# Patient Record
Sex: Female | Born: 1980 | Race: Black or African American | Hispanic: No | Marital: Single | Smoking: Current every day smoker
Health system: Southern US, Community
[De-identification: ages and names within clinical notes are randomized; demographics above are authoritative.]

## PROBLEM LIST (undated history)

## (undated) DIAGNOSIS — F419 Anxiety disorder, unspecified: Secondary | ICD-10-CM

## (undated) DIAGNOSIS — F329 Major depressive disorder, single episode, unspecified: Secondary | ICD-10-CM

## (undated) DIAGNOSIS — F32A Depression, unspecified: Secondary | ICD-10-CM

## (undated) DIAGNOSIS — G5600 Carpal tunnel syndrome, unspecified upper limb: Secondary | ICD-10-CM

---
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
## 2008-08-11 ENCOUNTER — Emergency Department (HOSPITAL_COMMUNITY): Admission: EM | Admit: 2008-08-11 | Discharge: 2008-08-11 | Payer: Self-pay | Admitting: Emergency Medicine

## 2008-12-17 ENCOUNTER — Emergency Department (HOSPITAL_COMMUNITY): Admission: EM | Admit: 2008-12-17 | Discharge: 2008-12-17 | Payer: Self-pay | Admitting: Emergency Medicine

## 2009-10-30 ENCOUNTER — Ambulatory Visit: Payer: Self-pay | Admitting: Internal Medicine

## 2010-02-12 ENCOUNTER — Emergency Department (HOSPITAL_COMMUNITY): Admission: EM | Admit: 2010-02-12 | Discharge: 2010-02-12 | Payer: Self-pay | Admitting: Emergency Medicine

## 2010-03-05 ENCOUNTER — Ambulatory Visit: Payer: Self-pay | Admitting: Internal Medicine

## 2011-01-13 IMAGING — CR DG SHOULDER 2+V*R*
3 series · 3 of 3 positions shown · non-contrast
Comparison: None.

CLINICAL DATA: Right shoulder pain for the past month.  No known
injury.

RIGHT SHOULDER - 2+ VIEW

[w shoulder ap internal righ]
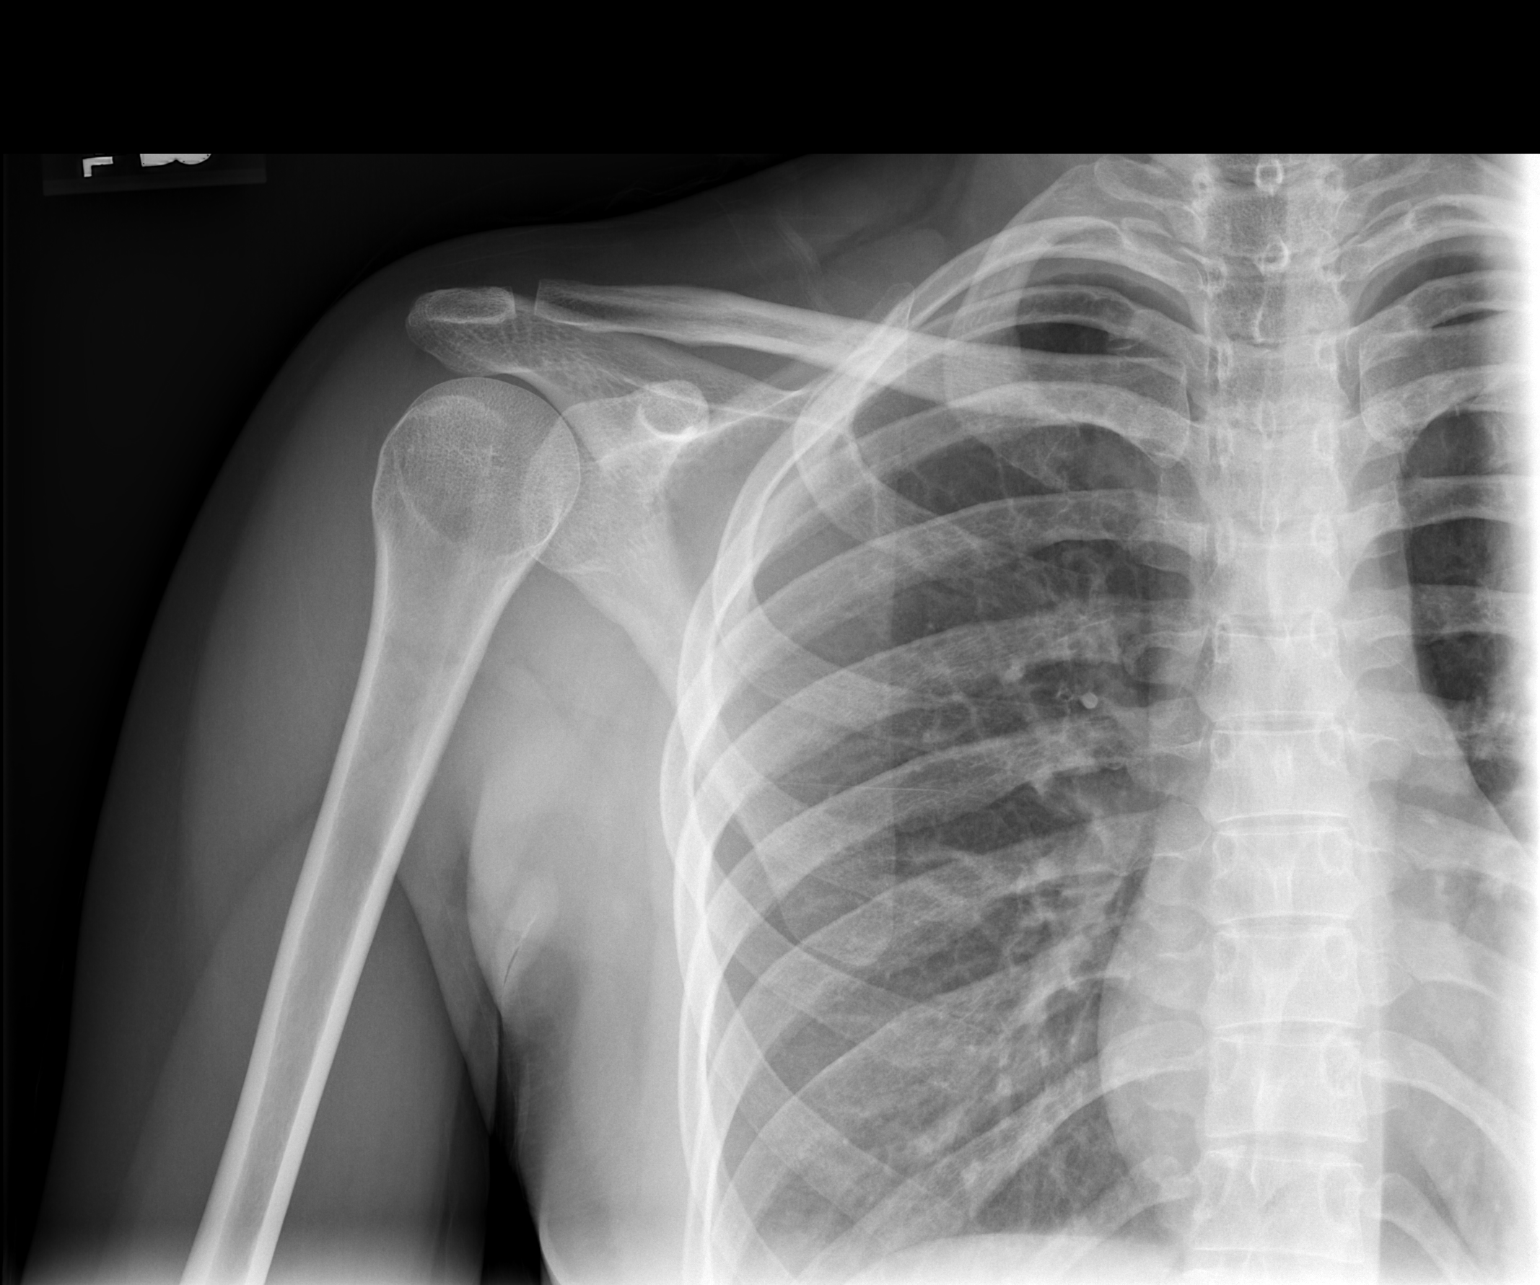

[w shoulder ap external righ]
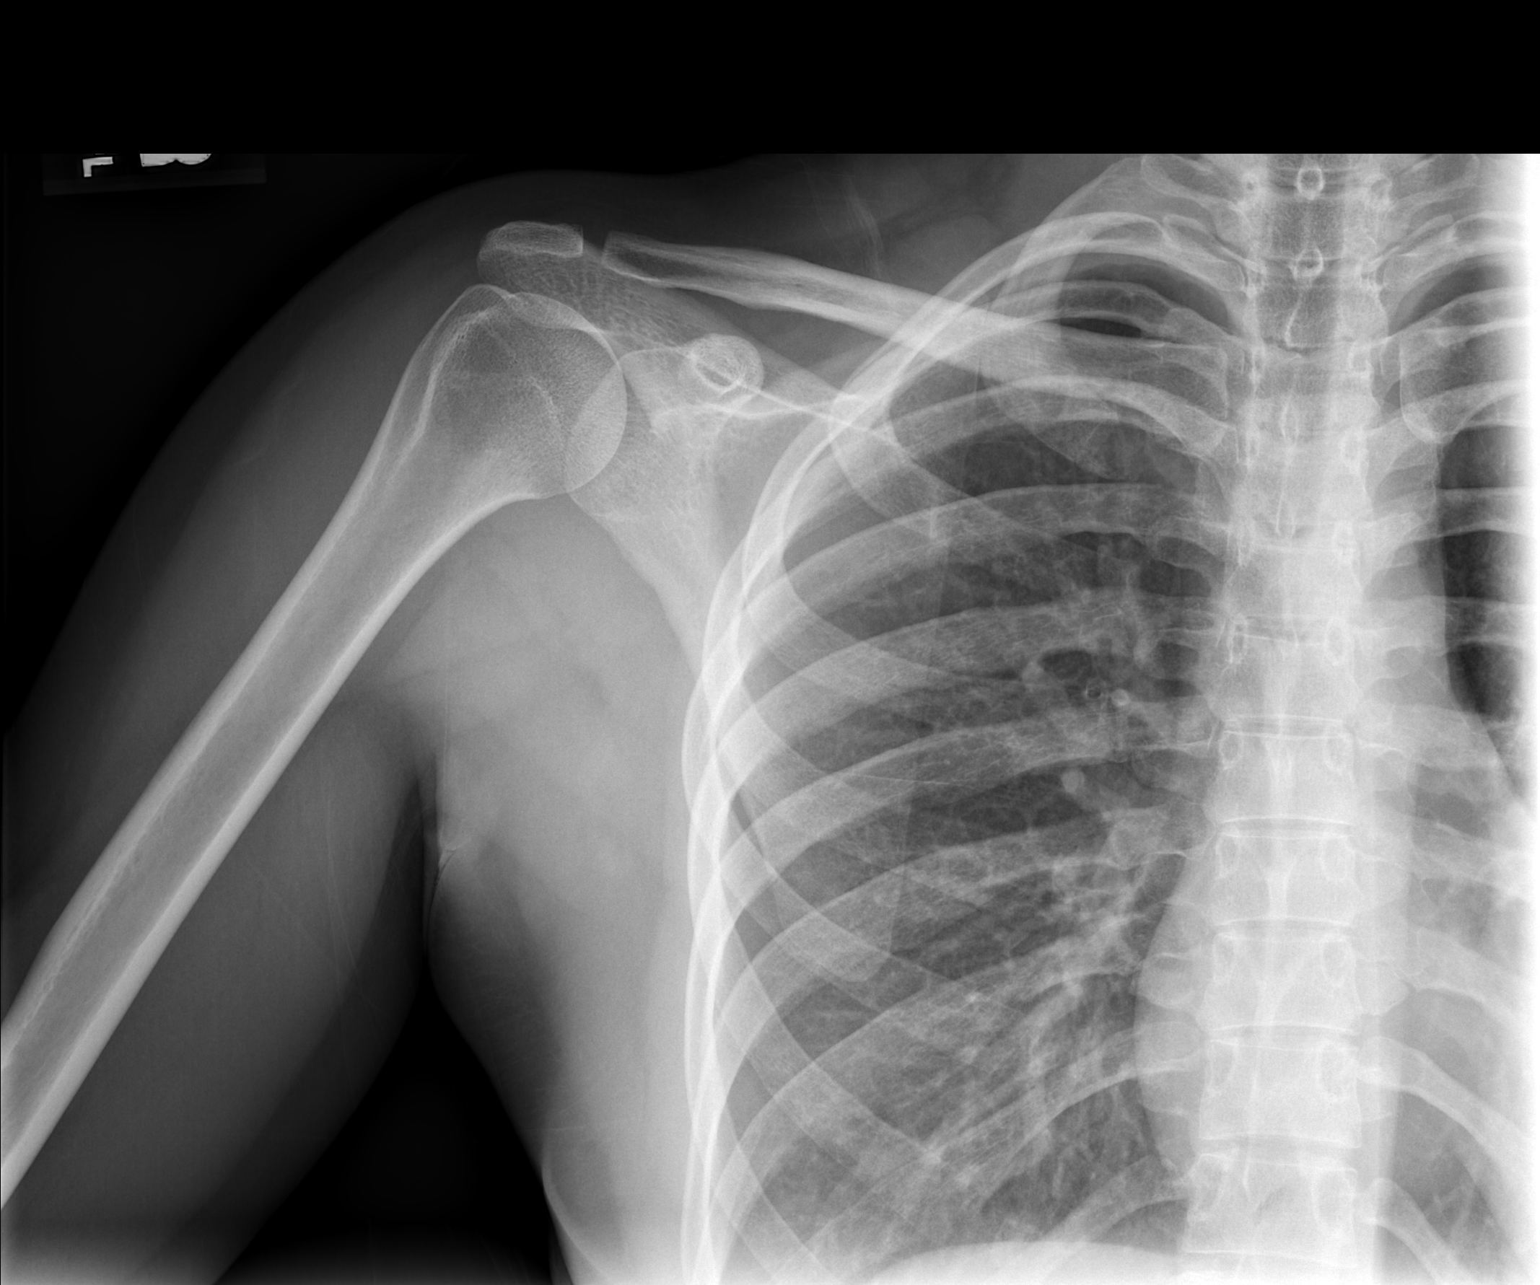

[w shoulder y view right]
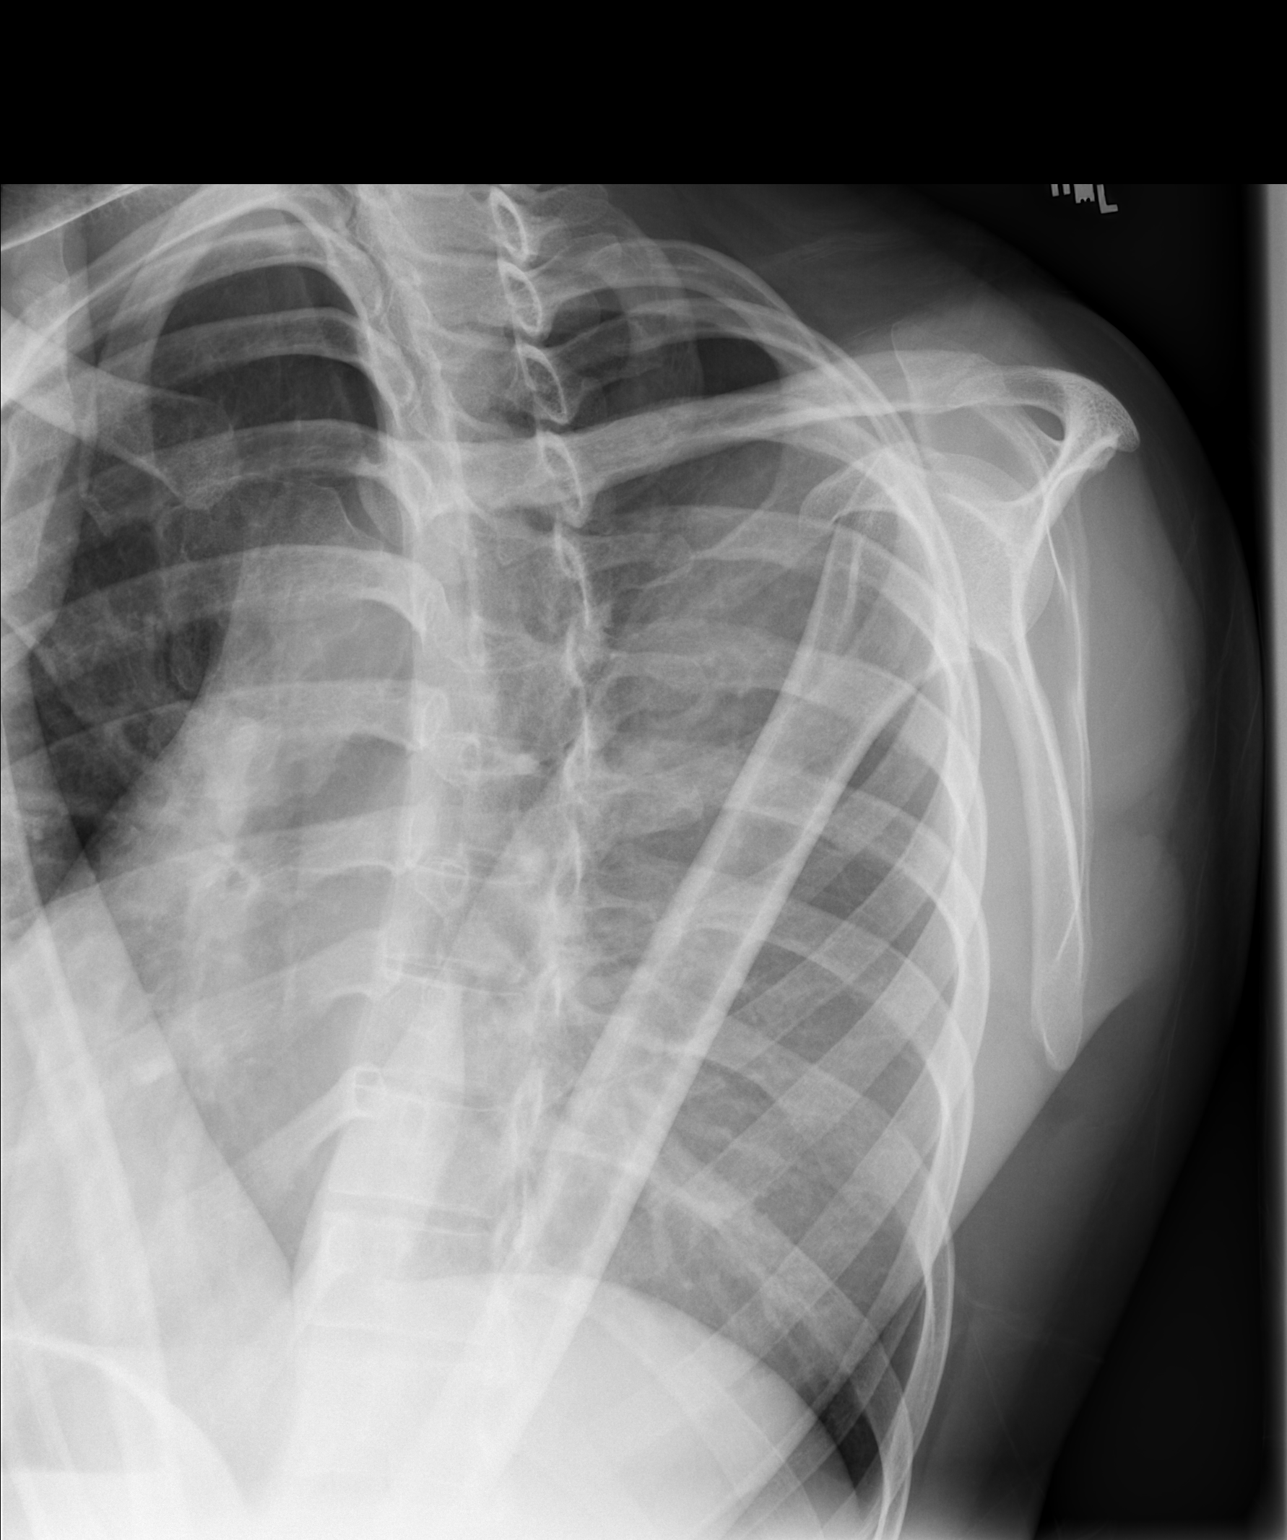

[3 of 3 positions shown; findings below may reference images not displayed]

FINDINGS: Normal appearing bones and soft tissues.
IMPRESSION: Normal examination.

## 2016-11-09 ENCOUNTER — Ambulatory Visit (HOSPITAL_COMMUNITY)
Admission: EM | Admit: 2016-11-09 | Discharge: 2016-11-09 | Disposition: A | Discharge status: Home / Self Care | Payer: Medicaid Other | Attending: Family Medicine | Admitting: Family Medicine

## 2016-11-09 ENCOUNTER — Encounter (HOSPITAL_COMMUNITY): Payer: Self-pay | Admitting: Emergency Medicine

## 2016-11-09 DIAGNOSIS — G5601 Carpal tunnel syndrome, right upper limb: Secondary | ICD-10-CM

## 2016-11-09 DIAGNOSIS — M778 Other enthesopathies, not elsewhere classified: Secondary | ICD-10-CM | POA: Diagnosis not present

## 2016-11-09 DIAGNOSIS — M7701 Medial epicondylitis, right elbow: Secondary | ICD-10-CM | POA: Diagnosis not present

## 2016-11-09 HISTORY — DX: Carpal tunnel syndrome, unspecified upper limb: G56.00

## 2016-11-09 MED ORDER — NAPROXEN 375 MG PO TABS: 375.0000 mg | ORAL_TABLET | Freq: Two times a day (BID) | ORAL | 0 refills | Status: DC

## 2016-11-09 NOTE — ED Triage Notes (Signed)
Something pulling in elbow whether she straightens or bends elbow.  No known injury.

## 2016-11-09 NOTE — Discharge Instructions (Signed)
Apply ice to the areas of soreness as we discussed. Do this before work, after work and during work if possible. Limit use of the arm and wrist as much as she possibly can. Wear the wrist splint at work and at home while working. You do not have to wear to bed. Take Naprosyn as prescribed and as needed for inflammation and pain. It takes several days for this to clear up. Follow-up with the orthopedist if you not getting better in a couple weeks. Call for an appointment his name and address his on this page.

## 2016-11-09 NOTE — ED Provider Notes (Signed)
CSN: [id number]     Arrival date & time 11/09/16  1022 History   First MD Initiated Contact with Patient 11/09/16 1104     No chief complaint on file.  (Consider location/radiation/quality/duration/timing/severity/associated sxs/prior Treatment) 36 year old female states that she has had jobs over the years that require repetitive movement of her right arm wrist, elbow etc. 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 She has recently developed right elbow pain primarily over the medial epicondyles. She also has wrist pain, tenderness that is exacerbated with flexion and extension. No blunt trauma or acute injury. Notes that she has had a history of diagnosis of carpal tunnel syndrome. And it seems to be flaring up at this time. It is associated with occasional numbness in the fingers of the right hand.      Past Medical History:  Diagnosis Date  . Carpal tunnel syndrome    Past Surgical History:  Procedure Laterality Date  . CESAREAN SECTION     No family history on file. Social History  Substance Use Topics  . Smoking status: Current Every Day Smoker  . Smokeless tobacco: Not on file  . Alcohol use Yes   OB History    No data available     Review of Systems  Constitutional: Negative.  Negative for activity change, chills and fever.  HENT: Negative.   Respiratory: Negative.   Cardiovascular: Negative.   Musculoskeletal:       As per HPI  Skin: Negative for color change, pallor and rash.  Neurological: Negative.   All other systems reviewed and are negative.   Allergies  Penicillins  Home Medications   Prior to Admission medications   Medication Sig Start Date End Date Taking? Authorizing Provider  acetaminophen (TYLENOL) 325 MG tablet Take 650 mg by mouth every 6 (six) hours as needed.   Yes [provider]  ibuprofen (ADVIL,MOTRIN) 200 MG tablet Take 200 mg by mouth every 6 (six) hours as needed.   Yes [provider]  naproxen (NAPROSYN) 375 MG tablet Take 1 tablet (375 mg total) by mouth 2 (two) times daily. 11/09/16   Hayden RasmussenMabe, Lounell Schumacher, NP   Meds Ordered and Administered this Visit  Medications - No data to display  BP 130/74 (BP Location: Left Arm)   Pulse 61   Temp 98.7 F (37.1 C) (Oral)   Resp 18   SpO2 100%  No data found.   Physical Exam  Constitutional: She is oriented to person, place, and time. She appears well-developed and well-nourished.  Eyes: EOM are normal.  Neck: Normal range of motion. Neck supple.  Cardiovascular: Normal rate.   Pulmonary/Chest: Effort normal.  Musculoskeletal: Normal range of motion. She exhibits tenderness. She exhibits no edema or deformity.  Tenderness over the tendons of the right medial epicondyles  and lesser to the lateral epicondyle. Also tenderness to the extensor surface of the right wrist with pain in the wrist and elbow associated with wrist extension against resistance. Distal neurovascular motor sensory is grossly intact. Brisk capillary refill and radial pulse 2+.  Neurological: She is alert and oriented to person, place, and time.  Skin: Skin is warm and dry.  Psychiatric: She has a normal mood and affect.  Nursing note and vitals reviewed.   Urgent Care Course     Procedures (including critical  care time)  Labs Review Labs Reviewed - No data to display  Imaging Review No results found.   Visual Acuity Review  Right Eye Distance:   Left Eye Distance:   Bilateral Distance:    Right Eye Near:   Left Eye Near:    Bilateral Near:         MDM   1. Right wrist tendinitis   2. Medial epicondylitis of right elbow   3. Carpal tunnel syndrome of right wrist    Apply ice to the areas of soreness as we discussed. Do this before work, after work and during work if possible. Limit use of the arm and  wrist as much as she possibly can. Wear the wrist splint at work and at home while working. You do not have to wear to bed. Take Naprosyn as prescribed and as needed for inflammation and pain. It takes several days for this to clear up. Follow-up with the orthopedist if you not getting better in a couple weeks. Call for an appointment his name and address his on this page. Meds ordered this encounter  Medications  . ibuprofen (ADVIL,MOTRIN) 200 MG tablet    Sig: Take 200 mg by mouth every 6 (six) hours as needed.  Marland Kitchen acetaminophen (TYLENOL) 325 MG tablet    Sig: Take 650 mg by mouth every 6 (six) hours as needed.  . naproxen (NAPROSYN) 375 MG tablet    Sig: Take 1 tablet (375 mg total) by mouth 2 (two) times daily.    Dispense:  20 tablet    Refill:  0    Order Specific Question:   Supervising Provider    Answer:   Elvina Sidle [[id number]]  Right wrist splint     Hayden Rasmussen, NP 11/09/16 1126

## 2018-12-21 ENCOUNTER — Emergency Department (HOSPITAL_COMMUNITY)
Admission: EM | Admit: 2018-12-21 | Discharge: 2018-12-22 | Disposition: A | Discharge status: Other Acute Inpatient Hospital | Payer: Medicaid Other | Attending: Emergency Medicine | Admitting: Emergency Medicine

## 2018-12-21 ENCOUNTER — Encounter (HOSPITAL_COMMUNITY): Payer: Self-pay

## 2018-12-21 ENCOUNTER — Other Ambulatory Visit: Payer: Self-pay

## 2018-12-21 DIAGNOSIS — X838XXA Intentional self-harm by other specified means, initial encounter: Secondary | ICD-10-CM | POA: Diagnosis not present

## 2018-12-21 DIAGNOSIS — Y999 Unspecified external cause status: Secondary | ICD-10-CM | POA: Insufficient documentation

## 2018-12-21 DIAGNOSIS — Z20828 Contact with and (suspected) exposure to other viral communicable diseases: Secondary | ICD-10-CM | POA: Insufficient documentation

## 2018-12-21 DIAGNOSIS — Z880 Allergy status to penicillin: Secondary | ICD-10-CM | POA: Diagnosis not present

## 2018-12-21 DIAGNOSIS — F17210 Nicotine dependence, cigarettes, uncomplicated: Secondary | ICD-10-CM | POA: Diagnosis not present

## 2018-12-21 DIAGNOSIS — R45851 Suicidal ideations: Secondary | ICD-10-CM | POA: Diagnosis not present

## 2018-12-21 DIAGNOSIS — F332 Major depressive disorder, recurrent severe without psychotic features: Secondary | ICD-10-CM | POA: Insufficient documentation

## 2018-12-21 DIAGNOSIS — T50902A Poisoning by unspecified drugs, medicaments and biological substances, intentional self-harm, initial encounter: Secondary | ICD-10-CM

## 2018-12-21 DIAGNOSIS — Y92038 Other place in apartment as the place of occurrence of the external cause: Secondary | ICD-10-CM | POA: Diagnosis not present

## 2018-12-21 DIAGNOSIS — Y939 Activity, unspecified: Secondary | ICD-10-CM | POA: Diagnosis not present

## 2018-12-21 DIAGNOSIS — T43022A Poisoning by tetracyclic antidepressants, intentional self-harm, initial encounter: Secondary | ICD-10-CM | POA: Insufficient documentation

## 2018-12-21 DIAGNOSIS — T1491XA Suicide attempt, initial encounter: Secondary | ICD-10-CM | POA: Diagnosis present

## 2018-12-21 HISTORY — DX: Depression, unspecified: F32.A

## 2018-12-21 LAB — CBC WITH DIFFERENTIAL/PLATELET
Abs Immature Granulocytes: 0.03 10*3/uL (ref 0.00–0.07)
Basophils Absolute: 0 10*3/uL (ref 0.0–0.1)
Basophils Relative: 0 %
Eosinophils Absolute: 0.1 10*3/uL (ref 0.0–0.5)
Eosinophils Relative: 1 %
HCT: 42.8 % (ref 36.0–46.0)
Hemoglobin: 14.4 g/dL (ref 12.0–15.0)
Immature Granulocytes: 0 %
Lymphocytes Relative: 19 %
Lymphs Abs: 1.9 10*3/uL (ref 0.7–4.0)
MCH: 33.9 pg (ref 26.0–34.0)
MCHC: 33.6 g/dL (ref 30.0–36.0)
MCV: 100.7 fL — ABNORMAL HIGH (ref 80.0–100.0)
Monocytes Absolute: 0.7 10*3/uL (ref 0.1–1.0)
Monocytes Relative: 7 %
Neutro Abs: 7.3 10*3/uL (ref 1.7–7.7)
Neutrophils Relative %: 73 %
Platelets: 342 10*3/uL (ref 150–400)
RBC: 4.25 MIL/uL (ref 3.87–5.11)
RDW: 15.4 % (ref 11.5–15.5)
WBC: 10 10*3/uL (ref 4.0–10.5)
nRBC: 0 % (ref 0.0–0.2)

## 2018-12-21 LAB — ACETAMINOPHEN LEVEL: Acetaminophen (Tylenol), Serum: <10 ug/mL — ABNORMAL LOW (ref 10–30)

## 2018-12-21 LAB — RAPID URINE DRUG SCREEN, HOSP PERFORMED
Amphetamines: POSITIVE — AB
Barbiturates: NOT DETECTED
Benzodiazepines: NOT DETECTED
Cocaine: POSITIVE — AB
Opiates: NOT DETECTED
Tetrahydrocannabinol: POSITIVE — AB

## 2018-12-21 LAB — COMPREHENSIVE METABOLIC PANEL
ALT: 11 U/L (ref 0–44)
AST: 17 U/L (ref 15–41)
Albumin: 4.1 g/dL (ref 3.5–5.0)
Alkaline Phosphatase: 67 U/L (ref 38–126)
Anion gap: 10 (ref 5–15)
BUN: 6 mg/dL (ref 6–20)
CO2: 25 mmol/L (ref 22–32)
Calcium: 8.6 mg/dL — ABNORMAL LOW (ref 8.9–10.3)
Chloride: 107 mmol/L (ref 98–111)
Creatinine, Ser: 0.61 mg/dL (ref 0.44–1.00)
GFR calc Af Amer: >60 mL/min (ref >60)
GFR calc non Af Amer: >60 mL/min (ref >60)
Glucose, Bld: 76 mg/dL (ref 70–99)
Potassium: 3.4 mmol/L — ABNORMAL LOW (ref 3.5–5.1)
Sodium: 142 mmol/L (ref 135–145)
Total Bilirubin: 0.5 mg/dL (ref 0.3–1.2)
Total Protein: 7.5 g/dL (ref 6.5–8.1)

## 2018-12-21 LAB — CBG MONITORING, ED: Glucose-Capillary: 76 mg/dL (ref 70–99)

## 2018-12-21 LAB — SALICYLATE LEVEL: Salicylate Lvl: <7 mg/dL (ref 2.8–30.0)

## 2018-12-21 LAB — ETHANOL: Alcohol, Ethyl (B): 24 mg/dL — ABNORMAL HIGH (ref <10)

## 2018-12-21 LAB — I-STAT BETA HCG BLOOD, ED (MC, WL, AP ONLY): I-stat hCG, quantitative: <5 m[IU]/mL (ref <5)

## 2018-12-21 LAB — SARS CORONAVIRUS 2 BY RT PCR (HOSPITAL ORDER, PERFORMED IN ~~LOC~~ HOSPITAL LAB): SARS Coronavirus 2: NEGATIVE

## 2018-12-21 MED ORDER — SODIUM CHLORIDE 0.9 % IV BOLUS
1000.0000 mL | Freq: Once | INTRAVENOUS | Status: AC
  Administered 2018-12-21: 1000 mL via INTRAVENOUS

## 2018-12-21 MED ORDER — POTASSIUM CHLORIDE CRYS ER 20 MEQ PO TBCR
40.0000 meq | EXTENDED_RELEASE_TABLET | Freq: Once | ORAL | Status: AC
  Administered 2018-12-21: 40 meq via ORAL
  Filled 2018-12-21: qty 2

## 2018-12-21 MED ORDER — ACETAMINOPHEN 325 MG PO TABS: 650.0000 mg | ORAL_TABLET | ORAL | Status: DC | PRN

## 2018-12-21 NOTE — BH Assessment (Signed)
Tele Assessment Note   Patient Name: Wanda SimmondsCailin Howard MRN: [id number] Referring Physician: Jodi GeraldsFord, Kelsey, PA-C Location of Patient: [facility] Location of Provider: Behavioral Health TTS Department  Wanda SimmondsCailin Howard is a 38 y.o. female who [facility] after being found unconscious on the gazebo outside an apartment complex.  Pt endorsed suicide attempt.  Pt is of no fixed address, and she is not followed by an outpatient psychiatric/therapy provider.  Pt provided history.  Pt reported that she is very depressed, and that on 12/20/2018 she tried to commit suicide by intentionally ingesting 3 tabs of remeron and 1/2 tab of Trazodone.  Pt endorsed continued suicidal ideation.  She also endorsed persistent despondency, insomnia (about two hours per night), fatigue; isolation; guilt; poor concentration.  Pt denied hallucination, self-injurious behavior, and homicidal ideation.  Pt endorsed daily use of marijuana, and episodic use of cocaine and alcohol.  Pt was positive for marijuana, alcohol, cocaine, and amphetamines (she siad she does not know how she is positive for amphetamines).  Pt reported she was treated inpatient when she was 16 -- [place] and [place]xia nervosa.  Pt requested inpatient care.  During assessment, Pt presented as alert and oriented.  She had fair eye contact and was cooperative.  She was dressed in scrubs, and she appeared appropriately groomed.  Pt's mood was depressed, and affect was blunted.  Pt's speech was soft and slow.  Pt's thought processes were within normal range, and thought content was logical and goal-oriented.  There was no evidence of delusion.  Pt's memory and concentration were fair.  Insight, judgment, and impulse control were deemed poor.  Consulted with Ephriam KnucklesS. Rankin, NP and Narda AmberJ. Norman, DO who determined that Pt meets inpatient criteria.  Diagnosis: Major depressive disorder, recurrent, severe, w/o  psychotic features  Past Medical History:  Past Medical History:  Diagnosis Date  . Carpal tunnel syndrome   . Depression     Past Surgical History:  Procedure Laterality Date  . CESAREAN SECTION      Family History: No family history on file.  Social History:  reports that she has been smoking cigarettes. She has been smoking about 1.00 pack per day. She has never used smokeless tobacco. She reports current alcohol use. She reports current drug use. Frequency: 7.00 times per week. Drugs: Amphetamines, Cocaine, and Marijuana.  Additional Social History:  Alcohol / Drug Use Pain Medications: See MAR Prescriptions: See MAR Over the Counter: See MAR History of alcohol / drug use?: Yes Substance #1 Name of Substance 1: Marijuana 1 - Amount (size/oz): varied amount 1 - Frequency: daily 1 - Duration: ongoing 1 - Last Use / Amount: 12/20/2018 Substance #2 Name of Substance 2: Alcohol 2 - Amount (size/oz): varied 2 - Frequency: weekly 2 - Duration: ongoing 2 - Last Use / Amount: 12/20/2018 Substance #3 Name of Substance 3: cocaine 3 - Amount (size/oz): varied 3 - Frequency: episodically 3 - Duration: ongoing 3 - Last Use / Amount: 12/20/2018 Substance #4 Name of Substance 4: Amphetamine 4 - Amount (size/oz): Pt is not sure if she uses or amount  CIWA: CIWA-Ar BP: 116/78 Pulse Rate: 79 COWS:    Allergies:  Allergies  Allergen Reactions  . Penicillins     Home Medications: (Not in a hospital admission)   OB/GYN Status:  No LMP recorded. Patient has had an injection.  General Assessment Data Location of Assessment: [facility] TTS Assessment: In system  Is this a Tele or Face-to-Face Assessment?: Tele Assessment Is this an Initial Assessment or a Re-assessment for this encounter?: Initial Assessment Patient Accompanied by:: N/A Language Other than English: No Living Arrangements: Homeless/Shelter What gender do you identify as?: Female Marital status:  Single Pregnancy Status: No Living Arrangements: Other (Comment) Can pt return to current living arrangement?: Yes Admission Status: Voluntary Is patient capable of signing voluntary admission?: Yes Referral Source: Self/Family/Friend Insurance type: [place] MCD     Crisis Care Plan Living Arrangements: Other (Comment) Name of Psychiatrist: None Name of Therapist: None  Education Status Is patient currently in school?: No Is the patient employed, unemployed or receiving disability?: Unemployed  Risk to self with the past 6 months Suicidal Ideation: Yes-Currently Present Has patient been a risk to self within the past 6 months prior to admission? : Yes Suicidal Intent: Yes-Currently Present Has patient had any suicidal intent within the past 6 months prior to admission? : Yes Is patient at risk for suicide?: Yes Suicidal Plan?: Yes-Currently Present Has patient had any suicidal plan within the past 6 months prior to admission? : Yes Specify Current Suicidal Plan: Pt intentionally ingested trazodone and remeron Access to Means: Yes Specify Access to Suicidal Means: street drugs What has been your use of drugs/alcohol within the last 12 months?: marijuana, alcohol, cocaine, amphetamines Previous Attempts/Gestures: No Intentional Self Injurious Behavior: Cutting Comment - Self Injurious Behavior: Hx of cutting; no recent cutting Family Suicide History: Unknown Recent stressful life event(s): Other (Comment)(Homelessness) Persecutory voices/beliefs?: No Depression: Yes Depression Symptoms: Despondent, Insomnia, Tearfulness, Isolating, Fatigue, Guilt, Loss of interest in usual pleasures, Feeling worthless/self pity Substance abuse history and/or treatment for substance abuse?: Yes Suicide prevention information given to non-admitted patients: Not applicable  Risk to Others within the past 6 months Homicidal Ideation: No Does patient have any lifetime risk of violence toward others  beyond the six months prior to admission? : No Thoughts of Harm to Others: No Current Homicidal Intent: No Current Homicidal Plan: No Access to Homicidal Means: No History of harm to others?: No Assessment of Violence: None Noted Does patient have access to weapons?: No Criminal Charges Pending?: No Does patient have a court date: No Is patient on probation?: No  Psychosis Hallucinations: None noted Delusions: None noted  Mental Status Report Appearance/Hygiene: Unremarkable, In scrubs Eye Contact: Fair Motor Activity: Freedom of movement, Unremarkable Speech: Logical/coherent, Soft, Slow Level of Consciousness: Drowsy Mood: Depressed Affect: Blunted Anxiety Level: None Thought Processes: Coherent, Relevant Judgement: Impaired Orientation: Place, Time, Situation, Person Obsessive Compulsive Thoughts/Behaviors: None  Cognitive Functioning Concentration: Fair Memory: Remote Intact, Recent Intact Is patient IDD: No Insight: Poor Impulse Control: Poor Appetite: Fair Have you had any weight changes? : No Change Sleep: Decreased Total Hours of Sleep: 2 Vegetative Symptoms: None  ADLScreening [facility] Assessment Services) Patient's cognitive ability adequate to safely complete daily activities?: Yes Patient able to express need for assistance with ADLs?: Yes Independently performs ADLs?: Yes (appropriate for developmental age)  Prior Inpatient Therapy Prior Inpatient Therapy: Yes Prior Therapy Dates: Not sure -- she was 16 Prior Therapy Facilty/Provider(s): [place] in [place] Reason for Treatment: Anorexia nervosa; depression  Prior Outpatient Therapy Prior Outpatient Therapy: No Does patient have an ACCT team?: No Does patient have Intensive In-House Services?  : No Does patient have [facility] services? : No Does patient have P4CC services?: No  ADL Screening (condition at time of admission) Patient's cognitive ability adequate to safely complete daily activities?:  Yes Is the patient deaf  or have difficulty hearing?: No Does the patient have difficulty seeing, even when wearing glasses/contacts?: No Does the patient have difficulty concentrating, remembering, or making decisions?: No Patient able to express need for assistance with ADLs?: Yes Independently performs ADLs?: Yes (appropriate for developmental age) Does the patient have difficulty walking or climbing stairs?: No Weakness of Legs: None Weakness of Arms/Hands: None  Home Assistive Devices/Equipment Home Assistive Devices/Equipment: None  Therapy Consults (therapy consults require a physician order) PT Evaluation Needed: No OT Evalulation Needed: No SLP Evaluation Needed: No Abuse/Neglect Assessment (Assessment to be complete while patient is alone) Abuse/Neglect Assessment Can Be Completed: Yes Physical Abuse: Denies Verbal Abuse: Denies Sexual Abuse: Denies Exploitation of patient/patient's resources: Denies Self-Neglect: Denies Values / Beliefs Cultural Requests During Hospitalization: None Spiritual Requests During Hospitalization: None Consults Spiritual Care Consult Needed: No Social Work Consult Needed: No Regulatory affairs officer (For Healthcare) Does Patient Have a Medical Advance Directive?: No Would patient like information on creating a medical advance directive?: No - Patient declined          Disposition:  Disposition Initial Assessment Completed for this Encounter: Yes Disposition of Patient: Admit(Per S. Rankin, NP, Pt meets inpt criteria --300)  This service was provided via telemedicine using a 2-way, interactive audio and video technology.  Names of all persons participating in this telemedicine service and their role in this encounter. Name: Wanda, Howard Role: Pt             Marlowe Aschoff 12/21/2018 12:22 PM

## 2018-12-21 NOTE — ED Notes (Signed)
Wanda Howard/Poison Control - Recommend EKG, electrolytes. Tylenol, salicylate levels. Fluids as needed.observe for 8 hours or until baseline achieved. Use Narcan cautiously.

## 2018-12-21 NOTE — ED Notes (Signed)
Patient is refusing Covid-19 test.

## 2018-12-21 NOTE — ED Notes (Signed)
Bed: [facility]B Expected date:  Expected time:  Means of arrival:  Comments: EMS-OD 

## 2018-12-21 NOTE — ED Notes (Signed)
Bed: [facility] Expected date:  Expected time:  Means of arrival:  Comments: 

## 2018-12-21 NOTE — ED Triage Notes (Signed)
Per EMS- Patient was found on a gazebo floor at [employer]. Patient reported that she was recently kicked out of her residence. Patient reported that she did want to kill herself. Patient states she took 1/2 tab Tramadol and Remeron 30 mg tabs x 4. Patient having Tremors.

## 2018-12-21 NOTE — ED Provider Notes (Signed)
[facility]-EMERGENCY DEPT Provider Note   CSN: [id number] Arrival date & time: 12/21/18  0856    History   Chief Complaint Chief Complaint  Patient presents with   Drug Overdose   Suicidal    HPI Wanda Howard is a 38 y.o. female.     Wanda Howard is a 38 y.o. female with history of carpal tunnel syndrome and depression, [facility].  Patient reports that her depression has been worsening and she wanted to kill herself, she reports she took half a tablet of tramadol and 4-1/2 30 mg of Remeron.  On arrival she is tearful and tremulous, reporting that her depression was too much and she did not want to deal with it anymore.  She reports the Remeron was prescribed to her and she had half a tablet of tramadol left.  She reports drinking last night, denies any other drug use or ingestions.  She denies any HI or AVH.  Denies any focal medical complaints today.  She has not had any fevers or recent illness.  She denies chest pain, shortness of breath, abdominal pain, nausea, vomiting, headaches, vision changes or weakness.  No other aggravating or alleviating factors.  Patient is reticent to talk more about her depression and stressors in her life but does report she was recently kicked out of her residence.  She was found on the floor of a gazebo by EMS [facility].     Past Medical History:  Diagnosis Date   Carpal tunnel syndrome     There are no active problems to display for this patient.   Past Surgical History:  Procedure Laterality Date   CESAREAN SECTION       OB History   No obstetric history on file.      Home Medications    Prior to Admission medications   Medication Sig Start Date End Date Taking? Authorizing Provider  acetaminophen (TYLENOL) 325 MG tablet Take 650 mg by mouth every 6 (six) hours as needed.    [provider]  ibuprofen (ADVIL,MOTRIN) 200 MG tablet Take 200 mg by  mouth every 6 (six) hours as needed.    [provider]  naproxen (NAPROSYN) 375 MG tablet Take 1 tablet (375 mg total) by mouth 2 (two) times daily. 11/09/16   Hayden RasmussenMabe, David, NP    Family History No family history on file.  Social History Social History   Tobacco Use   Smoking status: Current Every Day Smoker    Packs/day: 1.00    Types: Cigarettes   Smokeless tobacco: Never Used  Substance Use Topics   Alcohol use: Yes   Drug use: No     Allergies   Penicillins   Review of Systems Review of Systems  Constitutional: Negative for chills and fever.  HENT: Negative.   Eyes: Negative for visual disturbance.  Respiratory: Negative for cough.   Cardiovascular: Negative for chest pain.  Gastrointestinal: Negative for abdominal pain, nausea and vomiting.  Genitourinary: Negative for dysuria and frequency.  Musculoskeletal: Negative for arthralgias and myalgias.  Skin: Negative for color change and rash.  Neurological: Negative for syncope, light-headedness and headaches.  Psychiatric/Behavioral: Positive for dysphoric mood, self-injury and suicidal ideas. Negative for hallucinations. The patient is not nervous/anxious.      Physical Exam Updated Vital Signs BP 132/88    Pulse 83    Temp 98.1 F (36.7 C) (Oral)    Resp 20    Ht 4\' 11"  (  1.499 m)    Wt 49.9 kg    SpO2 100%    BMI 22.22 kg/m   Physical Exam Vitals signs and nursing note reviewed.  Constitutional:      General: She is not in acute distress.    Appearance: Normal appearance. She is well-developed and normal weight. She is not diaphoretic.     Comments: Tearful and tremulous but in no acute distress.  Alert and oriented  HENT:     Head: Normocephalic and atraumatic.     Mouth/Throat:     Mouth: Mucous membranes are moist.     Pharynx: Oropharynx is clear.     Comments: Multiple decaying teeth noted. Eyes:     General:        Right eye: No discharge.        Left eye: No discharge.      Extraocular Movements: Extraocular movements intact.     Conjunctiva/sclera: Conjunctivae normal.     Pupils: Pupils are equal, round, and reactive to light.  Neck:     Musculoskeletal: Neck supple.  Cardiovascular:     Rate and Rhythm: Normal rate and regular rhythm.     Pulses: Normal pulses.     Heart sounds: Normal heart sounds. No murmur. No friction rub. No gallop.   Pulmonary:     Effort: Pulmonary effort is normal. No respiratory distress.     Breath sounds: Normal breath sounds.     Comments: Respirations equal and unlabored, patient able to speak in full sentences, lungs clear to auscultation bilaterally Abdominal:     General: Abdomen is flat. Bowel sounds are normal. There is no distension.     Palpations: Abdomen is soft. There is no mass.     Tenderness: There is no abdominal tenderness. There is no guarding.     Comments: Abdomen soft, nondistended, nontender to palpation in all quadrants without guarding or peritoneal signs  Musculoskeletal:        General: No deformity.  Skin:    General: Skin is warm and dry.  Neurological:     Mental Status: She is alert.     Coordination: Coordination normal.     Comments: Speech is clear, able to follow commands CN III-XII intact Normal strength in upper and lower extremities bilaterally including dorsiflexion and plantar flexion, strong and equal grip strength Sensation normal to light and sharp touch Moves extremities without ataxia, coordination intact  Psychiatric:        Attention and Perception: She does not perceive auditory or visual hallucinations.        Mood and Affect: Mood is depressed. Affect is tearful.        Speech: Speech normal.        Behavior: Behavior is withdrawn. Behavior is cooperative.        Thought Content: Thought content includes suicidal ideation. Thought content does not include homicidal ideation. Thought content includes suicidal plan.      ED Treatments / Results  Labs (all labs ordered  are listed, but only abnormal results are displayed) Labs Reviewed  COMPREHENSIVE METABOLIC PANEL - Abnormal; Notable for the following components:      Result Value   Potassium 3.4 (*)    Calcium 8.6 (*)    All other components within normal limits  ETHANOL - Abnormal; Notable for the following components:   Alcohol, Ethyl (B) 24 (*)    All other components within normal limits  RAPID URINE DRUG SCREEN, HOSP PERFORMED - Abnormal; Notable for  the following components:   Cocaine POSITIVE (*)    Amphetamines POSITIVE (*)    Tetrahydrocannabinol POSITIVE (*)    All other components within normal limits  CBC WITH DIFFERENTIAL/PLATELET - Abnormal; Notable for the following components:   MCV 100.7 (*)    All other components within normal limits  ACETAMINOPHEN LEVEL - Abnormal; Notable for the following components:   Acetaminophen (Tylenol), Serum <10 (*)    All other components within normal limits  SARS CORONAVIRUS 2 (HOSPITAL ORDER, [place] LAB)  SALICYLATE LEVEL  CBG MONITORING, ED  I-STAT BETA HCG BLOOD, ED ([facility], [facility], [facility] ONLY)    EKG EKG Interpretation  Date/Time:  Wednesday December 21 2018 09:05:48 EDT Ventricular Rate:  90 PR Interval:    QRS Duration: 73 QT Interval:  352 QTC Calculation: 431 R Axis:   75 Text Interpretation:  Sinus rhythm Artifact No significant change was found Confirmed by Jola Schmidt [phone]) on 12/21/2018 9:19:12 AM   Radiology No results found.  Procedures Procedures (including critical care time)  Medications Ordered in ED Medications  potassium chloride SA (K-DUR) CR tablet 40 mEq (has no administration in time range)  sodium chloride 0.9 % bolus 1,000 mL (1,000 mLs Intravenous New Bag/Given 12/21/18 1014)     Initial Impression / Assessment and Plan / ED Course  I have reviewed the triage vital signs and the nursing notes.  Pertinent labs & imaging results that were available during my care of the patient were  reviewed by me and considered in my medical decision making (see chart for details).  Patient presents after intentional overdose with 4 tablets of Remeron and half a tablet of tramadol.  Reports her intent was to kill herself, she has been dealing with worsening depression and does not want to deal with it anymore.  She recently lost her housing.  She denies previous attempts to harm herself, denies any other ingestions.  No focal medical complaints, vitals normal on arrival.  Patient is tearful and tremulous but has no other focal findings on exam.  Normal neurologic exam.  No evidence of trauma.  Poison control consulted and they recommend 8-hour observation, EKG, electrolytes and Tylenol level.  Fluids as needed.  Screening labs reassuring, no leukocytosis, normal hemoglobin, potassium of 3.4 but no other electrolyte derangements, normal renal and liver function.  Ethanol level slightly elevated at 24, patient does report drinking last night, negative acetaminophen and salicylate levels, negative pregnancy.  UDS is positive for cocaine, amphetamines and THC, coronavirus test pending.  We will continue to observe patient for 8 hours.  As recommended by poison control but vitals are remaining stable and she seems to be at her baseline mental status.  TTS is seen and evaluated patient and they recommend inpatient treatment at [facility] once medically cleared.  Patient has been observed here in the emergency department for ~8 hours, patient has returned to baseline, and at this time is medically cleared.  TTS has recommended inpatient treatment, patient will be transferred to behavioral health Hospital when bed is available.  Final Clinical Impressions(s) / ED Diagnoses   Final diagnoses:  Intentional drug overdose, initial encounter [facility])  Suicidal ideation    ED Discharge Orders    None       Janet Berlin 12/21/18 1638    Jola Schmidt, MD 12/24/18 [phone]

## 2018-12-21 NOTE — ED Notes (Signed)
Bed: WA13 Expected date:  Expected time:  Means of arrival:  Comments: Hold for RES B 

## 2018-12-22 ENCOUNTER — Other Ambulatory Visit: Payer: Self-pay

## 2018-12-22 ENCOUNTER — Encounter (HOSPITAL_COMMUNITY): Payer: Self-pay

## 2018-12-22 ENCOUNTER — Inpatient Hospital Stay (HOSPITAL_COMMUNITY)
Admission: AD | Admit: 2018-12-22 | Discharge: 2018-12-26 | DRG: 885 | Disposition: A | Discharge status: Home / Self Care | Payer: Medicaid Other | Source: Intra-hospital | Attending: Psychiatry | Admitting: Psychiatry

## 2018-12-22 DIAGNOSIS — R45851 Suicidal ideations: Secondary | ICD-10-CM | POA: Diagnosis present

## 2018-12-22 DIAGNOSIS — Z1159 Encounter for screening for other viral diseases: Secondary | ICD-10-CM

## 2018-12-22 DIAGNOSIS — F1410 Cocaine abuse, uncomplicated: Secondary | ICD-10-CM | POA: Diagnosis present

## 2018-12-22 DIAGNOSIS — F332 Major depressive disorder, recurrent severe without psychotic features: Principal | ICD-10-CM | POA: Diagnosis present

## 2018-12-22 DIAGNOSIS — Z590 Homelessness: Secondary | ICD-10-CM

## 2018-12-22 DIAGNOSIS — F17210 Nicotine dependence, cigarettes, uncomplicated: Secondary | ICD-10-CM | POA: Diagnosis present

## 2018-12-22 MED ORDER — NICOTINE 21 MG/24HR TD PT24
21.0000 mg | MEDICATED_PATCH | Freq: Every day | TRANSDERMAL | Status: DC
  Filled 2018-12-22 (×6): qty 1

## 2018-12-22 MED ORDER — HYDROXYZINE HCL 25 MG PO TABS
25.0000 mg | ORAL_TABLET | Freq: Three times a day (TID) | ORAL | Status: DC | PRN
  Administered 2018-12-23: 25 mg via ORAL
  Filled 2018-12-22: qty 1

## 2018-12-22 MED ORDER — LORAZEPAM 1 MG PO TABS
1.0000 mg | ORAL_TABLET | Freq: Once | ORAL | Status: AC
  Administered 2018-12-22: 1 mg via ORAL
  Filled 2018-12-22: qty 1

## 2018-12-22 MED ORDER — TRAZODONE HCL 50 MG PO TABS
50.0000 mg | ORAL_TABLET | Freq: Every evening | ORAL | Status: DC | PRN
  Administered 2018-12-22: 50 mg via ORAL
  Filled 2018-12-22 (×5): qty 1

## 2018-12-22 NOTE — BH Assessment (Signed)
[facility] Assessment Progress Note  Per Buford Dresser, DO, this pt requires psychiatric hospitalization at this time.  Letitia Libra, RN, [facility] has assigned pt to [facility] Rm 302-2; [facility] will be ready to receive pt at 17:30.  Pt has signed Voluntary Admission and Consent for Treatment, as well as Consent to Release Information to a friend, and signed forms have been faxed to [facility]  Pt's nurse, Caren Griffins, has been notified, and agrees to send original paperwork along with pt via [facility], and to call report to [phone].  Jalene Mullet, [place] Coordinator [phone]

## 2018-12-22 NOTE — ED Notes (Signed)
Report called to [facility], nurse Demaris Callander.  Patient to be transported after 7:30 tonight

## 2018-12-22 NOTE — ED Notes (Signed)
Sitter order placed by this RN.  
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
## 2018-12-22 NOTE — ED Notes (Signed)
TTS at bedside. 

## 2018-12-22 NOTE — ED Notes (Signed)
Patient is resting comfortably. Sitter at bedside.  

## 2018-12-22 NOTE — ED Notes (Signed)
Patient moved over to TCU room 30 to wait for her transfer to behavioral health.

## 2018-12-22 NOTE — Discharge Summary (Addendum)
  Patient to be transferred to [facility] for inpatient psychiatric treatment  Patient seen face-to-face for psychiatric evaluation, chart reviewed and case discussed with the physician extender and developed treatment plan. Reviewed the information documented and agree with the treatment plan.  Buford Dresser, DO 12/22/18 4:31 PM

## 2018-12-22 NOTE — ED Notes (Signed)
Patient ambulatory to restroom with no assistance no problems.

## 2018-12-22 NOTE — ED Notes (Signed)
Patient wanded and transported over to [place].   Belongings at nursing station. Several bags.

## 2018-12-22 NOTE — Consult Note (Addendum)
  Patient accepted to [facility] for inpatient psychiatric treatment.    Wanda B. Rankin, NP  Patient seen face-to-face for psychiatric evaluation, chart reviewed and case discussed with the physician extender and developed treatment plan. Reviewed the information documented and agree with the treatment plan.  Buford Dresser, DO 12/22/18 4:31 PM

## 2018-12-22 NOTE — ED Notes (Signed)
Patient is resting comfortably. 

## 2018-12-22 NOTE — ED Notes (Signed)
Bed: WA30 Expected date:  Expected time:  Means of arrival:  Comments: Hall D 

## 2018-12-22 NOTE — Tx Team (Signed)
Initial Treatment Plan 12/22/2018 10:33 PM [name] TWS:[id number]    PATIENT STRESSORS: Financial difficulties Health problems Substance abuse Traumatic event   PATIENT STRENGTHS: Ability for insight Average or above average intelligence Capable of independent living Communication skills General fund of knowledge Motivation for treatment/growth Physical Health   PATIENT IDENTIFIED PROBLEMS: "depression"  "anxiety"  Substance abuse                 DISCHARGE CRITERIA:  Ability to meet basic life and health needs Adequate post-discharge living arrangements Improved stabilization in mood, thinking, and/or behavior Medical problems require only outpatient monitoring  PRELIMINARY DISCHARGE PLAN: Attend aftercare/continuing care group Attend PHP/IOP Attend 12-step recovery group Outpatient therapy Placement in alternative living arrangements  PATIENT/FAMILY INVOLVEMENT: This treatment plan has been presented to and reviewed with the patient, Wanda Howard.  The patient and family have been given the opportunity to ask questions and make suggestions.  Baron Sane, RN 12/22/2018, 10:33 PM

## 2018-12-22 NOTE — ED Notes (Signed)
Patient updated on plan of care

## 2018-12-23 DIAGNOSIS — F332 Major depressive disorder, recurrent severe without psychotic features: Principal | ICD-10-CM

## 2018-12-23 MED ORDER — GABAPENTIN 300 MG PO CAPS
300.0000 mg | ORAL_CAPSULE | Freq: Three times a day (TID) | ORAL | Status: DC
  Administered 2018-12-23 – 2018-12-26 (×9): 300 mg via ORAL
  Filled 2018-12-23 (×15): qty 1

## 2018-12-23 MED ORDER — TRAZODONE HCL 100 MG PO TABS
200.0000 mg | ORAL_TABLET | Freq: Every evening | ORAL | Status: DC | PRN
  Administered 2018-12-23 – 2018-12-24 (×3): 200 mg via ORAL
  Filled 2018-12-23 (×6): qty 2

## 2018-12-23 MED ORDER — FLUOXETINE HCL 20 MG PO CAPS
20.0000 mg | ORAL_CAPSULE | Freq: Every day | ORAL | Status: DC
  Filled 2018-12-23 (×3): qty 1

## 2018-12-23 MED ORDER — ACETAMINOPHEN 500 MG PO TABS
1000.0000 mg | ORAL_TABLET | Freq: Four times a day (QID) | ORAL | Status: DC | PRN
  Administered 2018-12-23: 1000 mg via ORAL
  Filled 2018-12-23: qty 2

## 2018-12-23 NOTE — Tx Team (Signed)
Interdisciplinary Treatment and Diagnostic Plan Update  12/23/2018 Time of Session: 9:00am Wanda Howard MRN: [id number]  Principal Diagnosis: <principal problem not specified>  Secondary Diagnoses: Active Problems:   MDD (major depressive disorder), recurrent episode, severe (HCC)   Current Medications:  Current Facility-Administered Medications  Medication Dose Route Frequency Provider Last Rate Last Dose  . hydrOXYzine (ATARAX/VISTARIL) tablet 25 mg  25 mg Oral TID PRN Lindon Romp A, NP      . nicotine (NICODERM CQ - dosed in mg/24 hours) patch 21 mg  21 mg Transdermal Daily Sharma Covert, MD      . traZODone (DESYREL) tablet 50 mg  50 mg Oral QHS,MR X 1 Lindon Romp A, NP   50 mg at 12/22/18 2204   PTA Medications: Medications Prior to Admission  Medication Sig Dispense Refill Last Dose  . medroxyPROGESTERone (DEPO-PROVERA) 150 MG/ML injection Inject 150 mg into the muscle every 3 (three) months.       Patient Stressors: Financial difficulties Health problems Substance abuse Traumatic event  Patient Strengths: Ability for insight Average or above average intelligence Capable of independent living Communication skills General fund of knowledge Motivation for treatment/growth Physical Health  Treatment Modalities: Medication Management, Group therapy, Case management,  1 to 1 session with clinician, Psychoeducation, Recreational therapy.   Physician Treatment Plan for Primary Diagnosis: <principal problem not specified> Long Term Goal(s):     Short Term Goals:    Medication Management: Evaluate patient's response, side effects, and tolerance of medication regimen.  Therapeutic Interventions: 1 to 1 sessions, Unit Group sessions and Medication administration.  Evaluation of Outcomes: Not Met  Physician Treatment Plan for Secondary Diagnosis: Active Problems:   MDD (major depressive disorder), recurrent episode, severe ([place])  Long Term Goal(s):      Short Term Goals:       Medication Management: Evaluate patient's response, side effects, and tolerance of medication regimen.  Therapeutic Interventions: 1 to 1 sessions, Unit Group sessions and Medication administration.  Evaluation of Outcomes: Not Met   RN Treatment Plan for Primary Diagnosis: <principal problem not specified> Long Term Goal(s): Knowledge of disease and therapeutic regimen to maintain health will improve  Short Term Goals: Ability to remain free from injury will improve, Ability to verbalize frustration and anger appropriately will improve, Ability to disclose and discuss suicidal ideas and Ability to identify and develop effective coping behaviors will improve  Medication Management: RN will administer medications as ordered by provider, will assess and evaluate patient's response and provide education to patient for prescribed medication. RN will report any adverse and/or side effects to prescribing provider.  Therapeutic Interventions: 1 on 1 counseling sessions, Psychoeducation, Medication administration, Evaluate responses to treatment, Monitor vital signs and CBGs as ordered, Perform/monitor CIWA, COWS, AIMS and Fall Risk screenings as ordered, Perform wound care treatments as ordered.  Evaluation of Outcomes: Not Met   LCSW Treatment Plan for Primary Diagnosis: <principal problem not specified> Long Term Goal(s): Safe transition to appropriate next level of care at discharge, Engage patient in therapeutic group addressing interpersonal concerns.  Short Term Goals: Engage patient in aftercare planning with referrals and resources, Increase social support, Identify triggers associated with mental health/substance abuse issues and Increase skills for wellness and recovery  Therapeutic Interventions: Assess for all discharge needs, 1 to 1 time with Social worker, Explore available resources and support systems, Assess for adequacy in community support network,  Educate family and significant other(s) on suicide prevention, Complete Psychosocial Assessment, Interpersonal group therapy.  Evaluation of  Outcomes: Not Met   Progress in Treatment: Attending groups: No. Participating in groups: No. Taking medication as prescribed: Yes. Toleration medication: Yes. Family/Significant other contact made: No, will contact:  supports if consents are granted Patient understands diagnosis: No. Discussing patient identified problems/goals with staff: Yes. Medical problems stabilized or resolved: No. Denies suicidal/homicidal ideation: No. Issues/concerns per patient self-inventory: Yes.  New problem(s) identified: Yes, Describe:  homelessness  New Short Term/Long Term Goal(s): detox, medication management for mood stabilization; elimination of SI thoughts; development of comprehensive mental wellness/sobriety plan.  Patient Goals:    Discharge Plan or Barriers: CSW continuing to assess for appropriate referrals.  Reason for Continuation of Hospitalization: Anxiety Depression Suicidal ideation Withdrawal symptoms  Estimated Length of Stay: 3 days  Attendees: Patient: 12/23/2018 10:22 AM  Physician:  12/23/2018 10:22 AM  Nursing:  12/23/2018 10:22 AM  RN Care Manager: 12/23/2018 10:22 AM  Social Worker: Stephanie Acre, Latanya Presser 12/23/2018 10:22 AM  Recreational Therapist:  12/23/2018 10:22 AM  Other:  12/23/2018 10:22 AM  Other:  12/23/2018 10:22 AM  Other: 12/23/2018 10:22 AM    Scribe for Treatment Team: Joellen Jersey, [place] 12/23/2018 10:22 AM

## 2018-12-23 NOTE — Progress Notes (Signed)
D: Pt was in dayroom upon initial approach.  Pt presents with anxious affect and mood.  She reports her day was "good" and her goal is "to get out of here tomorrow."  She states "I have somewhere to go, I got a job lined up."  Pt was encouraged to discuss further with provider and she verbalized understanding.  Pt denies SI/HI, denies hallucinations, denies pain.  Pt has been visible in milieu interacting with peers and staff appropriately.    A: Introduced self to pt.  Met with pt 1:1.  Actively listened to pt and offered support and encouragement.  Medication administered per order.  PRN medication administered for anxiety.  15 minute safety checks in place.  R: Pt is safe on the unit.  Pt is compliant with medications.  Pt verbally contracts for safety.  Will continue to monitor and assess.

## 2018-12-23 NOTE — Progress Notes (Signed)
Met with pt when she arrived on the unit.  When asked how she is doing, pt states she is "extremely agitated."  She did not want to elaborate.  Denies SI/HI, hallucinations, and pain.  Pt verbally contracts for safety.  On-site provider notified of pt's agitation and Ativan 1 mg POX1 was ordered and administered.  Medication administered per order.  Pt is safe on the unit.   

## 2018-12-23 NOTE — Progress Notes (Signed)
Recreation Therapy Notes  Date:  7.3.20 Time: 0930 Location: [address] Dayroom  Group Topic: Stress Management  Goal Area(s) Addresses:  Patient will identify positive stress management techniques. Patient will identify benefits of using stress management post d/c.  Intervention: Stress Management  Activity :  Meditation.  LRT introduced the stress management technique of meditation.  LRT played a meditation that focused on making the most of your day.  Patients were to follow along a meditation played to engage.  Education:  Stress Management, Discharge Planning.   Education Outcome: Acknowledges Education  Clinical Observations/Feedback: Pt did not attend group.    Victorino Sparrow, LRT/CTRS         Ria Comment, [place] Record A 12/23/2018 10:33 AM
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
## 2018-12-23 NOTE — Progress Notes (Signed)
Pt irritable on approach.  Pt rates depression 5/10, anxiety 3/10 and hopelessness 0. Pt denies SI/HI. Pt reports withdrawal symptoms of cravings, agitation and irritability. Pt reports fair sleep at bedtime. Pt stated goal "just keeping my thoughts and energy to a positive state."   Orders reviewed with pt. Verbal support provided. Pt encouraged to attend groups. 15 minute checks performed for safety.  Pt compliant with tx plan.

## 2018-12-23 NOTE — Progress Notes (Signed)
Pt refused Prozac stating that she took it in the past and it made her more irritable.

## 2018-12-23 NOTE — H&P (Signed)
Psychiatric Admission Assessment Adult  Patient Identification: Wanda SimmondsCailin Tibbs MRN:  [id number] Date of Evaluation:  12/23/2018 Chief Complaint:  COCAINE USE DISORDER MDD Principal Diagnosis: <principal problem not specified> Diagnosis:  Active Problems:   MDD (major depressive disorder), recurrent episode, severe (HCC)  History of Present Illness:  This is the first admission here but the second overall for this 38 year old homeless individual who had overdosed and was found in public passed out though it was only 3 tablets of mirtazapine and 1/2 tablet of trazodone, she stated at the time she did want to kill her self but of course this is a nonlethal amount of medication. She acknowledges chronic cannabis dependency, alcohol abuse cocaine abuse and states she does not need detox measures. States she is interested in some type of rehab or housing.  She denies wanting to harm herself now she can contract for safety here she denies withdrawal symptoms or thoughts of harming others.  She denies hallucinations.  She is very irritable makes no eye contact but does give me enough of an interview to complete the mental status exam below  Issues of secondary gain prominent  According to the assessment team - 7/1 note Wanda Howard is a 38 y.o. female who [facility] after being found unconscious on the gazebo outside an apartment complex.  Pt endorsed suicide attempt.  Pt is of no fixed address, and she is not followed by an outpatient psychiatric/therapy provider.  Pt provided history.  Pt reported that she is very depressed, and that on 12/20/2018 she tried to commit suicide by intentionally ingesting 3 tabs of remeron and 1/2 tab of Trazodone.  Pt endorsed continued suicidal ideation.  She also endorsed persistent despondency, insomnia (about two hours per night), fatigue; isolation; guilt; poor concentration.  Pt denied hallucination, self-injurious behavior, and homicidal  ideation.  Pt endorsed daily use of marijuana, and episodic use of cocaine and alcohol.  Pt was positive for marijuana, alcohol, cocaine, and amphetamines (she siad she does not know how she is positive for amphetamines).  Pt reported she was treated inpatient when she was 16 -- [place] and [place]orexia nervosa.  Pt requested inpatient care.  Associated Signs/Symptoms: Depression Symptoms:  anhedonia, (Hypo) Manic Symptoms:  Distractibility, Anxiety Symptoms:  Excessive Worry, Psychotic Symptoms:  n/a PTSD Symptoms: NA Total Time spent with patient: 45 minutes  Past Psychiatric History: 1 past admit  Is the patient at risk to self? Yes.    Has the patient been a risk to self in the past 6 months? No.  Has the patient been a risk to self within the distant past? No.  Is the patient a risk to others? No.  Has the patient been a risk to others in the past 6 months? No.  Has the patient been a risk to others within the distant past? No.   Prior Inpatient Therapy:   Prior Outpatient Therapy:    Alcohol Screening: 1. How often do you have a drink containing alcohol?: Never 2. How many drinks containing alcohol do you have on a typical day when you are drinking?: 1 or 2 3. How often do you have six or more drinks on one occasion?: Never AUDIT-C Score: 0 4. How often during the last year have you found that you were not able to stop drinking once you had started?: Never 5. How often during the last year have you failed to do what  was normally expected from you becasue of drinking?: Never 6. How often during the last year have you needed a first drink in the morning to get yourself going after a heavy drinking session?: Never 7. How often during the last year have you had a feeling of guilt of remorse after drinking?: Never 8. How often during the last year have you been unable to remember what happened the night before because you had  been drinking?: Never 9. Have you or someone else been injured as a result of your drinking?: No 10. Has a relative or friend or a doctor or another health worker been concerned about your drinking or suggested you cut down?: No Alcohol Use Disorder Identification Test Final Score (AUDIT): 0 Alcohol Brief Interventions/Follow-up: AUDIT Score <7 follow-up not indicated Substance Abuse History in the last 12 months:  Yes.   Consequences of Substance Abuse: NA Previous Psychotropic Medications: Yes  Psychological Evaluations: No  Past Medical History:  Past Medical History:  Diagnosis Date  . Carpal tunnel syndrome   . Depression     Past Surgical History:  Procedure Laterality Date  . CESAREAN SECTION     Family History: History reviewed. No pertinent family history. Family Psychiatric  History: ukn Tobacco Screening: Have you used any form of tobacco in the last 30 days? (Cigarettes, Smokeless Tobacco, Cigars, and/or Pipes): Yes Tobacco use, Select all that apply: 5 or more cigarettes per day Are you interested in Tobacco Cessation Medications?: Yes, will notify MD for an order Counseled patient on smoking cessation including recognizing danger situations, developing coping skills and basic information about quitting provided: Yes Social History:  Social History   Substance and Sexual Activity  Alcohol Use Yes   Comment: BAC was +24     Social History   Substance and Sexual Activity  Drug Use Yes  . Frequency: 7.0 times per week  . Types: Amphetamines, Cocaine, Marijuana   Comment: +Amph; THC; Coc    Additional Social History:                           Allergies:   Allergies  Allergen Reactions  . Latex Hives  . Penicillins Hives    Did it involve swelling of the face/tongue/throat, SOB, or low BP? No Did it involve sudden or severe rash/hives, skin peeling, or any reaction on the inside of your mouth or nose? No Did you need to seek medical attention at a  hospital or doctor's office? No When did it last happen? Doesn't recall If all above answers are "NO", may proceed with cephalosporin use.    Lab Results:  Results for orders placed or performed during the hospital encounter of 12/21/18 (from the past 48 hour(s))  SARS Coronavirus 2 (CEPHEID- Performed in [facility] hospital lab), Hosp Order     Status: None   Collection Time: 12/21/18 12:17 PM   Specimen: Nasopharyngeal Swab  Result Value Ref Range   SARS Coronavirus 2 NEGATIVE NEGATIVE    Comment: (NOTE) If result is NEGATIVE SARS-CoV-2 target nucleic acids are NOT DETECTED. The SARS-CoV-2 RNA is generally detectable in upper and lower  respiratory specimens during the acute phase of infection. The lowest  concentration of SARS-CoV-2 viral copies this assay can detect is 250  copies / mL. A negative result does not preclude SARS-CoV-2 infection  and should not be used as the sole basis for treatment or other  patient management decisions.  A negative result  may occur with  improper specimen collection / handling, submission of specimen other  than nasopharyngeal swab, presence of viral mutation(s) within the  areas targeted by this assay, and inadequate number of viral copies  (<250 copies / mL). A negative result must be combined with clinical  observations, patient history, and epidemiological information. If result is POSITIVE SARS-CoV-2 target nucleic acids are DETECTED. The SARS-CoV-2 RNA is generally detectable in upper and lower  respiratory specimens dur ing the acute phase of infection.  Positive  results are indicative of active infection with SARS-CoV-2.  Clinical  correlation with patient history and other diagnostic information is  necessary to determine patient infection status.  Positive results do  not rule out bacterial infection or co-infection with other viruses. If result is PRESUMPTIVE POSTIVE SARS-CoV-2 nucleic acids MAY BE PRESENT.   A presumptive  positive result was obtained on the submitted specimen  and confirmed on repeat testing.  While 2019 novel coronavirus  (SARS-CoV-2) nucleic acids may be present in the submitted sample  additional confirmatory testing may be necessary for epidemiological  and / or clinical management purposes  to differentiate between  SARS-CoV-2 and other Sarbecovirus currently known to infect humans.  If clinically indicated additional testing with an alternate test  methodology [phone]) is advised. The SARS-CoV-2 RNA is generally  detectable in upper and lower respiratory sp ecimens during the acute  phase of infection. The expected result is Negative. Fact Sheet for Patients:  StrictlyIdeas.no Fact Sheet for Healthcare Providers: BankingDealers.co.za This test is not yet approved or cleared by the [place] FDA and has been authorized for detection and/or diagnosis of SARS-CoV-2 by FDA under an Emergency Use Authorization (EUA).  This EUA will remain in effect (meaning this test can be used) for the duration of the COVID-19 declaration under Section 564(b)(1) of the Act, 21 U.S.C. section 360bbb-3(b)(1), unless the authorization is terminated or revoked sooner. Performed at [facility], [place] [address] [place], [place] [address]     Blood Alcohol level:  Lab Results  Component Value Date   ETH 24 (H) 57/32/2025    Metabolic Disorder Labs:  No results found for: HGBA1C, MPG No results found for: PROLACTIN No results found for: CHOL, TRIG, HDL, CHOLHDL, VLDL, LDLCALC  Current Medications: Current Facility-Administered Medications  Medication Dose Route Frequency Provider Last Rate Last Dose  . FLUoxetine (PROZAC) capsule 20 mg  20 mg Oral Daily Johnn Hai, MD      . gabapentin (NEURONTIN) capsule 300 mg  300 mg Oral TID Johnn Hai, MD      . hydrOXYzine (ATARAX/VISTARIL) tablet 25 mg  25 mg Oral TID PRN Lindon Romp A, NP      . nicotine (NICODERM CQ - dosed in mg/24 hours) patch 21 mg  21 mg Transdermal Daily Sharma Covert, MD      . traZODone (DESYREL) tablet 200 mg  200 mg Oral QHS,MR X 1 Johnn Hai, MD       PTA Medications: Medications Prior to Admission  Medication Sig Dispense Refill Last Dose  . medroxyPROGESTERone (DEPO-PROVERA) 150 MG/ML injection Inject 150 mg into the muscle every 3 (three) months.      Musculoskeletal: Strength & Muscle Tone: decreased Gait & Station: normal Patient leans: N/A  Psychiatric Specialty Exam: Physical Exam vital stable no acute withdrawal  ROS logical negative for head trauma or seizures/cardiovascular negative for hypertension or chest pain/GI GU negative for issues/endocrine negative for thyroid disorder  Blood pressure 132/86,  pulse 68, temperature 98.3 F (36.8 C), temperature source Oral, resp. rate 16, height 4\' 11"  (1.499 m), weight 51.3 kg, SpO2 98 %.Body mass index is 22.82 kg/m.  General Appearance: Casual  Eye Contact:  None  Speech:  Normal Rate  Volume:  Decreased  Mood:  Irritable and Indifferent  Affect:  Congruent  Thought Process:  Linear  Orientation:  Full (Time, Place, and Person)  Thought Content:  Logical, Rumination and Tangential  Suicidal Thoughts:  Yes.  without intent/plan  Homicidal Thoughts:  No  Memory:  Immediate;   Fair  Judgement:  Fair  Insight:  Fair  Psychomotor Activity:  Normal  Concentration:  Concentration: Poor  Recall:  Poor  Fund of Knowledge:  Poor  Language:  Good  Akathisia:  Negative  Handed:  Right  AIMS (if indicated):     Assets:  Physical Health Resilience  ADL's:  Intact  Cognition:  WNL  Sleep:  Number of Hours: 6.75       Treatment Plan Summary: Daily contact with patient to assess and evaluate symptoms and progress in treatment and Medication management  Observation Level/Precautions:  15 minute checks  Laboratory:  UDS  Psychotherapy: Rehab based   Medications: Fluoxetine and gabapentin  Consultations: Not necessary  Discharge Concerns: Housing long-term sobriety  Estimated LOS: 3-5  Other: Issues of secondary gain prominent, depression reported, chronic cannabis dependency and intermittent substance abuse, no desire to give up cannabis unfortunately   Physician Treatment Plan for Primary Diagnosis: For overdose no further treatment required for cannabis discuss abstinence for depression begin SRI for mood stabilization begin gabapentin  Long Term Goal(s): Improvement in symptoms so as ready for discharge  Short Term Goals: Ability to demonstrate self-control will improve, Ability to identify and develop effective coping behaviors will improve and Ability to maintain clinical measurements within normal limits will improve  Physician Treatment Plan for Secondary Diagnosis: Active Problems:   MDD (major depressive disorder), recurrent episode, severe (HCC)  Long Term Goal(s): Improvement in symptoms so as ready for discharge  Short Term Goals: Ability to demonstrate self-control will improve, Ability to maintain clinical measurements within normal limits will improve and Compliance with prescribed medications will improve  I certify that inpatient services furnished can reasonably be expected to improve the patient's condition.    Malvin JohnsFARAH,Peyton Spengler, MD 7/3/202011:23 AM

## 2018-12-24 MED ORDER — QUETIAPINE FUMARATE 50 MG PO TABS
50.0000 mg | ORAL_TABLET | Freq: Every day | ORAL | Status: DC
  Administered 2018-12-24 – 2018-12-25 (×2): 50 mg via ORAL
  Filled 2018-12-24 (×5): qty 1

## 2018-12-24 MED ORDER — ENSURE ENLIVE PO LIQD
237.0000 mL | Freq: Two times a day (BID) | ORAL | Status: DC
  Administered 2018-12-25 (×2): 237 mL via ORAL

## 2018-12-24 NOTE — Progress Notes (Signed)
[facility] NOVEL CORONAVIRUS (COVID-19) DAILY CHECK-OFF SYMPTOMS - answer yes or no to each - every day NO YES  Have you had a fever in the past 24 hours?  . Fever (Temp > 37.80C / 100F) X   Have you had any of these symptoms in the past 24 hours? . New Cough .  Sore Throat  .  Shortness of Breath .  Difficulty Breathing .  Unexplained Body Aches   X   Have you had any one of these symptoms in the past 24 hours not related to allergies?   . Runny Nose .  Nasal Congestion .  Sneezing   X   If you have had runny nose, nasal congestion, sneezing in the past 24 hours, has it worsened?  X   EXPOSURES - check yes or no X   Have you traveled outside the state in the past 14 days?  X   Have you been in contact with someone with a confirmed diagnosis of COVID-19 or PUI in the past 14 days without wearing appropriate PPE?  X   Have you been living in the same home as a person with confirmed diagnosis of COVID-19 or a PUI (household contact)?    X   Have you been diagnosed with COVID-19?    X              What to do next: Answered NO to all: Answered YES to anything:   Proceed with unit schedule Follow the [facility] Inpatient Flowsheet.   

## 2018-12-24 NOTE — [facility] Group Notes (Signed)
LCSW Group Therapy Note  12/24/2018    10:00-11:00am   Type of Therapy and Topic:  Group Therapy: Early Messages Received About Anger  Participation Level:  Active   Description of Group:   In this group, patients shared and discussed the early messages received in their lives about anger through parental or other adult modeling, teaching, repression, punishment, violence, and more.  Participants identified how those childhood lessons influence even now how they usually or often react when angered.  The group discussed that anger is a secondary emotion and what may be the underlying emotional themes that come out through anger outbursts or that are ignored through anger suppression.   Therapeutic Goals: 1. Patients will identify one or more childhood message about anger that they received and how it was taught to them. 2. Patients will discuss how these childhood experiences have influenced and continue to influence their own expression or repression of anger even today. 3. Patients will explore possible primary emotions that tend to fuel their secondary emotion of anger. 4. Patients will learn that anger itself is normal and cannot be eliminated, and that healthier coping skills can assist with resolving conflict rather than worsening situations.  Summary of Patient Progress:  The patient shared that her childhood lessons about anger were that is something you react to impulsively.  As a result, she has had black outs when she became angry, not remembering later what she even did.  She now tries to think about things longer before reacting.  Therapeutic Modalities:   Cognitive Behavioral Therapy Motivation Interviewing  Wanda Howard  .

## 2018-12-24 NOTE — Progress Notes (Signed)
[facility] Group Notes:  (Nursing/MHT/Case Management/Adjunct)  Date:  12/24/2018  Time:  2030  Type of Therapy:  wrap up group  Participation Level:  Active  Participation Quality:  Appropriate, Attentive, Sharing and Supportive  Affect:  Appropriate  Cognitive:  Appropriate  Insight:  Improving  Engagement in Group:  Engaged  Modes of Intervention:  Clarification, Education and Support  Summary of Progress/Problems: Pt reported really enjoying nurse Patty's group today. Pt shared that she is ready to "get my shit together", get a job and an apartment. Pt is grateful for the little support that she has by friends and sister.   Winfield Rast S 12/24/2018, 10:03 PM

## 2018-12-24 NOTE — [facility] Group Notes (Signed)
[facility] Group Notes:  (Nursing)  Date:  12/24/2018  Time:100 PM Type of Therapy:  Nurse Education  Participation Level:  Active  Participation Quality:  Appropriate  Affect:  Appropriate  Cognitive:  Appropriate  Insight:  Appropriate  Engagement in Group:  Engaged  Modes of Intervention:  Discussion and Education  Summary of Progress/Problems: Life Skills Group  Waymond Cera 12/24/2018, 4:28 PM

## 2018-12-24 NOTE — Plan of Care (Signed)
  Problem: Safety: Goal: Periods of time without injury will increase Outcome: Progressing Note:  Pt has not harmed self or others tonight.  She denies SI/HI and verbally contracts for safety.    

## 2018-12-24 NOTE — Progress Notes (Signed)
Wanda Howard is friendly upon approach- observed interacting well with peers in the milieu.[name] currently denies SI/HI and AVHA. Labs and vitals monitored. Howard compliant with medications. Howard supported emotionally and encouraged to express concerns and ask questions.   R. Howard remains safe with 15 minute checks. Will continue POC.

## 2018-12-24 NOTE — Progress Notes (Signed)
D: Pt was in dayroom upon initial approach.  Pt presents with anxious affect and mood.  She states "I didn't get any sleep last night."  Her goal is to sleep better tonight.  Pt denies SI/HI, denies hallucinations, denies pain.  Pt has been visible in milieu interacting with peers and staff appropriately.  Pt attended evening group.    A: Met with pt 1:1.  Actively listened to pt and offered support and encouragement. Medications administered per order.  15 minute safety checks in place.  R: Pt is safe on the unit.  Pt is compliant with medications.  Pt verbally contracts for safety.  Will continue to monitor and assess.

## 2018-12-24 NOTE — Progress Notes (Signed)
[facility]  12/24/2018 10:38 AM Wanda Howard  MRN:  [id number]   Subjective:  Wanda Howard  Stated " I had a lapse in judgment, but I am feeling better and I am ready to go."  Evaluation: [name] observed sitting in day room interacting with peers.  She is awake alert and oriented x3.  Denying suicidal or homicidal ideation.  Patient appears to be minimizing symptoms as she has requested to discharge soon.  Reports I guess it was a suicide or overdose attempt however did not mean her actions at the time.  Patient has declined Prozac as was initiated on admission.  Reports this medication made me feel weird when taking in the past.  Discussed initiating a different antidepressant patient had declined at this time.  She reports a good appetite.  States she is resting well throughout the night.  Support encouragement reassurance was provided.   Principal Problem: <principal problem not specified> Diagnosis: Active Problems:   MDD (major depressive disorder), recurrent episode, severe (HCC)  Total Time spent with patient: 15 minutes  Past Psychiatric History:   Past Medical History:  Past Medical History:  Diagnosis Date  . Carpal tunnel syndrome   . Depression     Past Surgical History:  Procedure Laterality Date  . CESAREAN SECTION     Family History: History reviewed. No pertinent family history. Family Psychiatric  History:  Social History:  Social History   Substance and Sexual Activity  Alcohol Use Yes   Comment: BAC was +24     Social History   Substance and Sexual Activity  Drug Use Yes  . Frequency: 7.0 times per week  . Types: Amphetamines, Cocaine, Marijuana   Comment: +Amph; THC; Coc    Social History   Socioeconomic History  . Marital status: Single    Spouse name: Not on file  . Number of children: Not on file  . Years of education: Not on file  . Highest education level: Not on file  Occupational History  . Occupation: Unemployed  Social Needs  .  Financial resource strain: Not on file  . Food insecurity    Worry: Not on file    Inability: Not on file  . Transportation needs    Medical: Not on file    Non-medical: Not on file  Tobacco Use  . Smoking status: Current Every Day Smoker    Packs/day: 1.00    Types: Cigarettes  . Smokeless tobacco: Never Used  Substance and Sexual Activity  . Alcohol use: Yes    Comment: BAC was +24  . Drug use: Yes    Frequency: 7.0 times per week    Types: Amphetamines, Cocaine, Marijuana    Comment: +Amph; THC; Coc  . Sexual activity: Not Currently    Birth control/protection: Injection  Lifestyle  . Physical activity    Days per week: Not on file    Minutes per session: Not on file  . Stress: Not on file  Relationships  . Social Musicianconnections    Talks on phone: Not on file    Gets together: Not on file    Attends religious service: Not on file    Active member of club or organization: Not on file    Attends meetings of clubs or organizations: Not on file    Relationship status: Not on file  Other Topics Concern  . Not on file  Social History Narrative   Unemployed, no fixed address   Additional Social History:  Sleep: Fair  Appetite:  Fair  Current Medications: Current Facility-Administered Medications  Medication Dose Route Frequency Provider Last Rate Last Dose  . acetaminophen (TYLENOL) tablet 1,000 mg  1,000 mg Oral Q6H PRN Johnn Hai, MD   1,000 mg at 12/23/18 1316  . gabapentin (NEURONTIN) capsule 300 mg  300 mg Oral TID Johnn Hai, MD   300 mg at 12/24/18 0845  . hydrOXYzine (ATARAX/VISTARIL) tablet 25 mg  25 mg Oral TID PRN Rozetta Nunnery, NP   25 mg at 12/23/18 2108  . nicotine (NICODERM CQ - dosed in mg/24 hours) patch 21 mg  21 mg Transdermal Daily Sharma Covert, MD      . traZODone (DESYREL) tablet 200 mg  200 mg Oral QHS,MR X 1 Johnn Hai, MD   200 mg at 12/24/18 0004    Lab Results: No results found for this or any  previous visit (from the past 37 hour(s)).  Blood Alcohol level:  Lab Results  Component Value Date   ETH 24 (H) 18/84/1660    Metabolic Disorder Labs: No results found for: HGBA1C, MPG No results found for: PROLACTIN No results found for: CHOL, TRIG, HDL, CHOLHDL, VLDL, LDLCALC  Physical Findings: AIMS: Facial and Oral Movements Muscles of Facial Expression: None, normal Lips and Perioral Area: None, normal Jaw: None, normal Tongue: None, normal,Extremity Movements Upper (arms, wrists, hands, fingers): None, normal Lower (legs, knees, ankles, toes): None, normal, Trunk Movements Neck, shoulders, hips: None, normal, Overall Severity Severity of abnormal movements (highest score from questions above): None, normal Incapacitation due to abnormal movements: None, normal Patient's awareness of abnormal movements (rate only patient's report): No Awareness, Dental Status Current problems with teeth and/or dentures?: No Does patient usually wear dentures?: No  CIWA:    COWS:     Musculoskeletal: Strength & Muscle Tone: within normal limits Gait & Station: normal Patient leans: N/A  Psychiatric Specialty Exam: Physical Exam  Constitutional: She appears well-developed and well-nourished.    Review of Systems  Psychiatric/Behavioral: Positive for depression. Negative for suicidal ideas (on admission). The patient is nervous/anxious.     Blood pressure 114/72, pulse (!) 112, temperature 98.1 F (36.7 C), temperature source Oral, resp. rate 18, height 4\' 11"  (1.499 m), weight 51.3 kg, SpO2 98 %.Body mass index is 22.82 kg/m.  General Appearance: Casual  Eye Contact:  Good  Speech:  Clear and Coherent  Volume:  Normal  Mood:  Anxious and Depressed  Affect:  Congruent  Thought Process:  Coherent  Orientation:  Full (Time, Place, and Person)  Thought Content:  Logical  Suicidal Thoughts:  No  Homicidal Thoughts:  No  Memory:  Immediate;   Fair Recent;   Fair Remote;   Fair   Judgement:  Fair  Insight:  Fair  Psychomotor Activity:  Normal  Concentration:  Concentration: Fair  Recall:  [employer] of Knowledge:  Fair  Language:  Fair  Akathisia:  No  Handed:  Right  AIMS (if indicated):     Assets:  Communication Skills Desire for Improvement Resilience Social Support  ADL's:  Intact  Cognition:  WNL  Sleep:  Number of Hours: 6.25     Treatment Plan Summary: Daily contact with patient to assess and evaluate symptoms and progress in treatment and Medication management   Continue with current treatment plan 12/24/2018 as listed below except as noted  Major depressive disorder  Prozac was discontinued 7/3 -patient declined initiation of a different antidepressant Cotninue Neurontin 300 mg p.o. 3  times daily Continue trazodone 200 mg p.o. nightly  CSW to continue working on discharge disposition Patient encouraged to participate with therapy  Oneta Rackanika N Verdean Murin, NP 12/24/2018, 10:38 AM
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
## 2018-12-25 LAB — TSH: TSH: 1.759 u[IU]/mL (ref 0.350–4.500)

## 2018-12-25 MED ORDER — TRAZODONE HCL 100 MG PO TABS
100.0000 mg | ORAL_TABLET | Freq: Every evening | ORAL | Status: DC | PRN
  Administered 2018-12-25: 100 mg via ORAL
  Filled 2018-12-25 (×6): qty 1

## 2018-12-25 NOTE — Progress Notes (Addendum)
D. Pt very groggy, remaining in bed for most of the am- did not attend group led by SW, stating, "I'm too tired, I'll go to group this afternoon". Per pt's self inventory, pt rated her depression, hopelessness and anxiety all 0's. Pt currently denies SI/HI and AVH  A. Labs and vitals monitored. Pt compliant with meds. Pt supported emotionally and encouraged to express concerns and ask questions.   R. Pt remains safe with 15 minute checks. Will continue POC.

## 2018-12-25 NOTE — Progress Notes (Signed)
[place] NOVEL CORONAVIRUS (COVID-19) DAILY CHECK-OFF SYMPTOMS - answer yes or no to each - every day NO YES  Have you had a fever in the past 24 hours?  . Fever (Temp > 37.80C / 100F) X   Have you had any of these symptoms in the past 24 hours? . New Cough .  Sore Throat  .  Shortness of Breath .  Difficulty Breathing .  Unexplained Body Aches   X   Have you had any one of these symptoms in the past 24 hours not related to allergies?   . Runny Nose .  Nasal Congestion .  Sneezing   X   If you have had runny nose, nasal congestion, sneezing in the past 24 hours, has it worsened?  X   EXPOSURES - check yes or no X   Have you traveled outside the state in the past 14 days?  X   Have you been in contact with someone with a confirmed diagnosis of COVID-19 or PUI in the past 14 days without wearing appropriate PPE?  X   Have you been living in the same home as a person with confirmed diagnosis of COVID-19 or a PUI (household contact)?    X   Have you been diagnosed with COVID-19?    X              What to do next: Answered NO to all: Answered YES to anything:   Proceed with unit schedule Follow the [facility] Inpatient Flowsheet.   

## 2018-12-25 NOTE — Progress Notes (Addendum)
[facility]  12/25/2018 11:21 AM Lourena SimmondsCailin Isidoro  MRN:  [id number]   Subjective:  Wanda Howard reported " I am feeling oversedated, which is why it is hard for me to wake up this morning."  Evaluation: [place] observed resting in bed.  She reports initiating Seroquel 50 mg p.o. nightly on last night.  In addition to trazodone 200 mg p.o. nightly to aid with her mood.  She denies suicidal or homicidal ideations.  Denies auditory or visual hallucinations.  Patient continues to request to be discharged soon.  Discussed downward titration with trazodone 200 mg -100 mg. Patient was receptive to plan.  She reports feeling low energy however she attributes it to medications.  Reports attending group sessions 1 time on yesterday.  Reports a good appetite.  Rates her depression 3 out of 10 with 10 being the worst.  Reports her anxiety is 1 out of 10 with 10 being the worst states she is resting well throughout the night.  Support,  Encouragement and  reassurance was provided  Principal Problem: <principal problem not specified> Diagnosis: Active Problems:   MDD (major depressive disorder), recurrent episode, severe (HCC)  Total Time spent with patient: 15 minutes  Past Psychiatric History:   Past Medical History:  Past Medical History:  Diagnosis Date  . Carpal tunnel syndrome   . Depression     Past Surgical History:  Procedure Laterality Date  . CESAREAN SECTION     Family History: History reviewed. No pertinent family history. Family Psychiatric  History:  Social History:  Social History   Substance and Sexual Activity  Alcohol Use Yes   Comment: BAC was +24     Social History   Substance and Sexual Activity  Drug Use Yes  . Frequency: 7.0 times per week  . Types: Amphetamines, Cocaine, Marijuana   Comment: +Amph; THC; Coc    Social History   Socioeconomic History  . Marital status: Single    Spouse name: Not on file  . Number of children: Not on file  . Years of  education: Not on file  . Highest education level: Not on file  Occupational History  . Occupation: Unemployed  Social Needs  . Financial resource strain: Not on file  . Food insecurity    Worry: Not on file    Inability: Not on file  . Transportation needs    Medical: Not on file    Non-medical: Not on file  Tobacco Use  . Smoking status: Current Every Day Smoker    Packs/day: 1.00    Types: Cigarettes  . Smokeless tobacco: Never Used  Substance and Sexual Activity  . Alcohol use: Yes    Comment: BAC was +24  . Drug use: Yes    Frequency: 7.0 times per week    Types: Amphetamines, Cocaine, Marijuana    Comment: +Amph; THC; Coc  . Sexual activity: Not Currently    Birth control/protection: Injection  Lifestyle  . Physical activity    Days per week: Not on file    Minutes per session: Not on file  . Stress: Not on file  Relationships  . Social Musicianconnections    Talks on phone: Not on file    Gets together: Not on file    Attends religious service: Not on file    Active member of club or organization: Not on file    Attends meetings of clubs or organizations: Not on file    Relationship status: Not on file  Other Topics  Concern  . Not on file  Social History Narrative   Unemployed, no fixed address   Additional Social History:                         Sleep: Fair  Appetite:  Fair  Current Medications: Current Facility-Administered Medications  Medication Dose Route Frequency Provider Last Rate Last Dose  . acetaminophen (TYLENOL) tablet 1,000 mg  1,000 mg Oral Q6H PRN Johnn Hai, MD   1,000 mg at 12/23/18 1316  . feeding supplement (ENSURE ENLIVE) (ENSURE ENLIVE) liquid 237 mL  237 mL Oral BID BM Money, Darnelle Maffucci B, FNP   237 mL at 12/25/18 1027  . gabapentin (NEURONTIN) capsule 300 mg  300 mg Oral TID Johnn Hai, MD   300 mg at 12/24/18 1755  . hydrOXYzine (ATARAX/VISTARIL) tablet 25 mg  25 mg Oral TID PRN Rozetta Nunnery, NP   25 mg at 12/23/18 2108  .  nicotine (NICODERM CQ - dosed in mg/24 hours) patch 21 mg  21 mg Transdermal Daily Sharma Covert, MD      . QUEtiapine (SEROQUEL) tablet 50 mg  50 mg Oral QHS Sharma Covert, MD   50 mg at 12/24/18 2114  . traZODone (DESYREL) tablet 100 mg  100 mg Oral QHS,MR X 1 Derrill Center, NP        Lab Results:  Results for orders placed or performed during the hospital encounter of 12/22/18 (from the past 48 hour(s))  TSH     Status: None   Collection Time: 12/25/18  6:35 AM  Result Value Ref Range   TSH 1.759 0.350 - 4.500 uIU/mL    Comment: Performed by a 3rd Generation assay with a functional sensitivity of <=0.01 uIU/mL. Performed at [facility], [place] [address] [place], [place] [address]     Blood Alcohol level:  Lab Results  Component Value Date   ETH 24 (H) 26/94/8546    Metabolic Disorder Labs: No results found for: HGBA1C, MPG No results found for: PROLACTIN No results found for: CHOL, TRIG, HDL, CHOLHDL, VLDL, LDLCALC  Physical Findings: AIMS: Facial and Oral Movements Muscles of Facial Expression: None, normal Lips and Perioral Area: None, normal Jaw: None, normal Tongue: None, normal,Extremity Movements Upper (arms, wrists, hands, fingers): None, normal Lower (legs, knees, ankles, toes): None, normal, Trunk Movements Neck, shoulders, hips: None, normal, Overall Severity Severity of abnormal movements (highest score from questions above): None, normal Incapacitation due to abnormal movements: None, normal Patient's awareness of abnormal movements (rate only patient's report): No Awareness, Dental Status Current problems with teeth and/or dentures?: No Does patient usually wear dentures?: No  CIWA:    COWS:     Musculoskeletal: Strength & Muscle Tone: within normal limits Gait & Station: normal Patient leans: N/A  Psychiatric Specialty Exam: Physical Exam  Constitutional: She appears well-developed and well-nourished.    Review of  Systems  Psychiatric/Behavioral: Positive for depression. Negative for suicidal ideas (on admission). The patient is nervous/anxious.   All other systems reviewed and are negative.   Blood pressure 106/75, pulse 98, temperature 98 F (36.7 C), temperature source Oral, resp. rate 18, height 4\' 11"  (1.499 m), weight 51.3 kg, SpO2 100 %.Body mass index is 22.82 kg/m.  General Appearance: Casual  Eye Contact:  Good  Speech:  Clear and Coherent  Volume:  Normal  Mood:  Anxious and Depressed  Affect:  Congruent  Thought Process:  Coherent  Orientation:  Full (  Time, Place, and Person)  Thought Content:  Logical  Suicidal Thoughts:  No  Homicidal Thoughts:  No  Memory:  Immediate;   Fair Recent;   Fair Remote;   Fair  Judgement:  Fair  Insight:  Fair  Psychomotor Activity:  Normal  Concentration:  Concentration: Fair  Recall:  [employer]  Fund of Knowledge:  Fair  Language:  Fair  Akathisia:  No  Handed:  Right  AIMS (if indicated):     Assets:  Communication Skills Desire for Improvement Resilience Social Support  ADL's:  Intact  Cognition:  WNL  Sleep:  Number of Hours: 5.5     Treatment Plan Summary: Daily contact with patient to assess and evaluate symptoms and progress in treatment and Medication management   Continue with current treatment plan 12/25/2018 as listed below except as noted  Major depressive disorder  Prozac was discontinued 7/3 -patient declined initiation of a different antidepressant Cotninue Neurontin 300 mg p.o. 3 times daily Decreased trazodone 200 mg to 100 mg p.o. nightly as needed Continue Seroquel 50 mg p.o. nightly  CSW to continue working on discharge disposition Patient encouraged to participate with therapy  Oneta Rackanika N Samuele Storey, NP 12/25/2018, 11:21 AM

## 2018-12-25 NOTE — [facility] Group Notes (Addendum)
[place] Group Notes: (Clinical Social Work)   12/25/2018      Type of Therapy:  Group Therapy   Participation Level:  Did Not Attend - was invited both individually by [facility] and by overhead announcement, chose not to attend.   Selmer Dominion, LCSW 12/25/2018, 1:21 PM

## 2018-12-25 NOTE — [facility] Group Notes (Signed)
[facility] Group Notes: Nursing Education Group  Date:  12/25/2018  Time:  1:15 PM  Facilitator: Patty D., RN  Type of Therapy:  Nurse Education  Participation Level:  Active  Participation Quality:  Appropriate  Affect:  Appropriate  Cognitive:  Alert and Oriented  Insight:  Appropriate  Engagement in Group:  Developing/Improving  Modes of Intervention:  Activity, Discussion, Socialization and Support  Summary of Progress/Problems: Patient attended group and was not disruptive in the discussion.   Wanda Howard 12/25/2018, 1:15 PM 

## 2018-12-25 NOTE — Progress Notes (Signed)
D: Pt was in dayroom upon initial approach.  Pt presents with appropriate affect and mood.  She states "I'm sluggish."  When asked about her goal, pt states "I hope I'm leaving tomorrow."  Pt reports she feels safe to discharge.  Pt denies SI/HI, denies hallucinations, denies pain.  Pt has been visible in milieu interacting with peers and staff appropriately.    A: Met with pt 1:1.  Actively listened to pt and offered support and encouragement. Medications administered per order.  15 minute safety checks in place.  R: Pt is safe on the unit.  Pt is compliant with medications.  Pt verbally contracts for safety.  Will continue to monitor and assess.

## 2018-12-26 MED ORDER — QUETIAPINE FUMARATE 50 MG PO TABS: 50.0000 mg | ORAL_TABLET | Freq: Every day | ORAL | 2 refills | Status: AC

## 2018-12-26 MED ORDER — GABAPENTIN 300 MG PO CAPS: 300.0000 mg | ORAL_CAPSULE | Freq: Three times a day (TID) | ORAL | 1 refills | Status: DC

## 2018-12-26 NOTE — Tx Team (Signed)
Interdisciplinary Treatment and Diagnostic Plan Update  12/26/2018 Time of Session: 10:32am Wanda SimmondsCailin Howard MRN: [id number]  Principal Diagnosis: <principal problem not specified>  Secondary Diagnoses: Active Problems:   MDD (major depressive disorder), recurrent episode, severe (HCC)   Current Medications:  Current Facility-Administered Medications  Medication Dose Route Frequency Provider Last Rate Last Dose  . acetaminophen (TYLENOL) tablet 1,000 mg  1,000 mg Oral Q6H PRN Malvin JohnsFarah, Brian, MD   1,000 mg at 12/23/18 1316  . feeding supplement (ENSURE ENLIVE) (ENSURE ENLIVE) liquid 237 mL  237 mL Oral BID BM Money, Wanda Howard B, FNP   237 mL at 12/25/18 2110  . gabapentin (NEURONTIN) capsule 300 mg  300 mg Oral TID Malvin JohnsFarah, Brian, MD   300 mg at 12/26/18 0818  . hydrOXYzine (ATARAX/VISTARIL) tablet 25 mg  25 mg Oral TID PRN Jackelyn PolingBerry, Jason A, NP   25 mg at 12/23/18 2108  . nicotine (NICODERM CQ - dosed in mg/24 hours) patch 21 mg  21 mg Transdermal Daily Antonieta Pertlary, Greg Lawson, MD      . QUEtiapine (SEROQUEL) tablet 50 mg  50 mg Oral QHS Antonieta Pertlary, Greg Lawson, MD   50 mg at 12/25/18 2110  . traZODone (DESYREL) tablet 100 mg  100 mg Oral QHS,MR X 1 Oneta RackLewis, Wanda N, NP   100 mg at 12/25/18 2110   PTA Medications: Medications Prior to Admission  Medication Sig Dispense Refill Last Dose  . medroxyPROGESTERone (DEPO-PROVERA) 150 MG/ML injection Inject 150 mg into the muscle every 3 (three) months.       Patient Stressors: Financial difficulties Health problems Substance abuse Traumatic event  Patient Strengths: Ability for insight Average or above average intelligence Capable of independent living Communication skills General fund of knowledge Motivation for treatment/growth Physical Health  Treatment Modalities: Medication Management, Group therapy, Case management,  1 to 1 session with clinician, Psychoeducation, Recreational therapy.   Physician Treatment Plan for Primary Diagnosis: <principal  problem not specified> Long Term Goal(s): Improvement in symptoms so as ready for discharge Improvement in symptoms so as ready for discharge   Short Term Goals: Ability to demonstrate self-control will improve Ability to identify and develop effective coping behaviors will improve Ability to maintain clinical measurements within normal limits will improve Ability to demonstrate self-control will improve Ability to maintain clinical measurements within normal limits will improve Compliance with prescribed medications will improve  Medication Management: Evaluate patient's response, side effects, and tolerance of medication regimen.  Therapeutic Interventions: 1 to 1 sessions, Unit Group sessions and Medication administration.  Evaluation of Outcomes: Adequate for Discharge  Physician Treatment Plan for Secondary Diagnosis: Active Problems:   MDD (major depressive disorder), recurrent episode, severe (HCC)  Long Term Goal(s): Improvement in symptoms so as ready for discharge Improvement in symptoms so as ready for discharge   Short Term Goals: Ability to demonstrate self-control will improve Ability to identify and develop effective coping behaviors will improve Ability to maintain clinical measurements within normal limits will improve Ability to demonstrate self-control will improve Ability to maintain clinical measurements within normal limits will improve Compliance with prescribed medications will improve     Medication Management: Evaluate patient's response, side effects, and tolerance of medication regimen.  Therapeutic Interventions: 1 to 1 sessions, Unit Group sessions and Medication administration.  Evaluation of Outcomes: Adequate for Discharge   RN Treatment Plan for Primary Diagnosis: <principal problem not specified> Long Term Goal(s): Knowledge of disease and therapeutic regimen to maintain health will improve  Short Term Goals: Ability to participate in  decision making will improve, Ability to verbalize feelings will improve, Ability to disclose and discuss suicidal ideas, Ability to identify and develop effective coping behaviors will improve and Compliance with prescribed medications will improve  Medication Management: RN will administer medications as ordered by provider, will assess and evaluate patient's response and provide education to patient for prescribed medication. RN will report any adverse and/or side effects to prescribing provider.  Therapeutic Interventions: 1 on 1 counseling sessions, Psychoeducation, Medication administration, Evaluate responses to treatment, Monitor vital signs and CBGs as ordered, Perform/monitor CIWA, COWS, AIMS and Fall Risk screenings as ordered, Perform wound care treatments as ordered.  Evaluation of Outcomes: Adequate for Discharge   LCSW Treatment Plan for Primary Diagnosis: <principal problem not specified> Long Term Goal(s): Safe transition to appropriate next level of care at discharge, Engage patient in therapeutic group addressing interpersonal concerns.  Short Term Goals: Engage patient in aftercare planning with referrals and resources and Increase skills for wellness and recovery  Therapeutic Interventions: Assess for all discharge needs, 1 to 1 time with Social worker, Explore available resources and support systems, Assess for adequacy in community support network, Educate family and significant other(s) on suicide prevention, Complete Psychosocial Assessment, Interpersonal group therapy.  Evaluation of Outcomes: Adequate for Discharge   Progress in Treatment: Attending groups: Yes. Participating in groups: Yes. Taking medication as prescribed: Yes. Toleration medication: Yes. Family/Significant other contact made: No, will contact:  CSW attempted to contact twice Patient understands diagnosis: Yes. Discussing patient identified problems/goals with staff: Yes. Medical problems  stabilized or resolved: Yes. Denies suicidal/homicidal ideation: Yes. Issues/concerns per patient self-inventory: No. Other:   New problem(s) identified: No, Describe:  None  New Short Term/Long Term Goal(s): Medication stabilization, elimination of SI thoughts, and development of a comprehensive mental wellness plan.   Patient Goals:    Discharge Plan or Barriers: Patient is discharging today.   Reason for Continuation of Hospitalization: Patient is discharging today.   Estimated Length of Stay: Patient is discharging today.   Attendees: Patient: 12/26/2018   Physician: Dr. Myles Lipps 12/26/2018   Nursing: Yetta Flock, RN 12/26/2018   RN Care Manager: 12/26/2018   Social Worker: Ardelle Anton, LCSW 12/26/2018   Recreational Therapist:  12/26/2018  Other:  12/26/2018  Other:  12/26/2018  Other: 12/26/2018     Scribe for Treatment Team: Trecia Rogers, LCSW 12/26/2018 10:42 AM

## 2018-12-26 NOTE — [facility] Suicide Risk Assessment (Signed)
[facility] Discharge Suicide Risk Assessment   Principal Problem: Homelessness/issues of secondary gain/chronic cannabis dependency/abuse of cocaine/self-harm gesture nonlethal overdose Discharge Diagnoses: Active Problems:   MDD (major depressive disorder), recurrent episode, severe ([place])   Total Time spent with patient: 45 minutes  Musculoskeletal: Strength & Muscle Tone: within normal limits Gait & Station: normal Patient leans: N/A  Psychiatric Specialty Exam: ROS  Blood pressure 106/75, pulse 98, temperature 98 F (36.7 C), temperature source Oral, resp. rate 18, height 4\' 11"  (1.499 m), weight 51.3 kg, SpO2 100 %.Body mass index is 22.82 kg/m.  General Appearance: Casual  Eye Contact::  Poor  Speech:  Normal Rate409  Volume:  Decreased  Mood:  less irritable  Affect:  Congruent  Thought Process:  Coherent and Descriptions of Associations: Intact  Orientation:  Full (Time, Place, and Person)  Thought Content:  Rumination  Suicidal Thoughts:  No  Homicidal Thoughts:  no  Memory:  Immediate;   Fair  Judgement:  Fair  Insight:  Fair  Psychomotor Activity:  Normal  Concentration:  Fair  Recall:  [employer] of Knowledge:Fair  Language: Fair  Akathisia:  Negative  Handed:  Right  AIMS (if indicated):     Assets:  Communication Skills Leisure Time Physical Health Resilience  Sleep:  Number of Hours: 6.5  Cognition: WNL  ADL's:  Intact   Mental Status Per Nursing Assessment::   On Admission:  Self-harm thoughts, Suicidal ideation indicated by others, Self-harm behaviors, Suicide plan, Intention to act on suicide plan, Plan includes specific time, place, or method  Demographic Factors:  Low socioeconomic status and Unemployed  Loss Factors: Decrease in vocational status  Historical Factors: NA  Risk Reduction Factors:   Religious beliefs about death  Continued Clinical Symptoms:  Alcohol/Substance Abuse/Dependencies  Cognitive Features That Contribute To Risk:   Polarized thinking    Suicide Risk:  Minimal: No identifiable suicidal ideation.  Patients presenting with no risk factors but with morbid ruminations; may be classified as minimal risk based on the severity of the depressive symptoms    Plan Of Care/Follow-up recommendations:  Activity:  full  Prescious Hurless, MD 12/26/2018, 8:12 AM

## 2018-12-26 NOTE — Progress Notes (Signed)
Discharge note: Patient reviewed discharge paperwork with RN including prescriptions, follow up appointments, and lab work. Patient given the opportunity to ask questions. All concerns were addressed. All belongings were returned to patient. Denied SI/HI/AVH. Patient thanked staff for their care while at the hospital.  Patient was discharged to lobby where her friend was waiting to pick her up.

## 2018-12-26 NOTE — Discharge Summary (Signed)
Physician Discharge Summary Note  Patient:  Wanda Howard is an 38 y.o., female MRN:  [id number] DOB:  17-Apr-1981 Patient phone:  [phone] (home)  [place]   [place] [id number],  Total Time spent with patient: 15 minutes  Date of Admission:  12/22/2018 Date of Discharge: 12/26/18  Reason for Admission:  Overdose on 3 Remeron 30 mg tablets, 1/2 tablet of tramadol  Principal Problem: <principal problem not specified> Discharge Diagnoses: Active Problems:   MDD (major depressive disorder), recurrent episode, severe (HCC)   Past Psychiatric History: History of depression and polysubstance abuse. Pt reported she was treated inpatient when she was 16 -- [place] and [place]xia nervosa.   Past Medical History:  Past Medical History:  Diagnosis Date  . Carpal tunnel syndrome   . Depression     Past Surgical History:  Procedure Laterality Date  . CESAREAN SECTION     Family History: History reviewed. No pertinent family history. Family Psychiatric  History: Denies Social History:  Social History   Substance and Sexual Activity  Alcohol Use Yes   Comment: BAC was +24     Social History   Substance and Sexual Activity  Drug Use Yes  . Frequency: 7.0 times per week  . Types: Amphetamines, Cocaine, Marijuana   Comment: +Amph; THC; Coc    Social History   Socioeconomic History  . Marital status: Single    Spouse name: Not on file  . Number of children: Not on file  . Years of education: Not on file  . Highest education level: Not on file  Occupational History  . Occupation: Unemployed  Social Needs  . Financial resource strain: Not on file  . Food insecurity    Worry: Not on file    Inability: Not on file  . Transportation needs    Medical: Not on file    Non-medical: Not on file  Tobacco Use  . Smoking status: Current Every Day Smoker    Packs/day: 1.00    Types: Cigarettes  .  Smokeless tobacco: Never Used  Substance and Sexual Activity  . Alcohol use: Yes    Comment: BAC was +24  . Drug use: Yes    Frequency: 7.0 times per week    Types: Amphetamines, Cocaine, Marijuana    Comment: +Amph; THC; Coc  . Sexual activity: Not Currently    Birth control/protection: Injection  Lifestyle  . Physical activity    Days per week: Not on file    Minutes per session: Not on file  . Stress: Not on file  Relationships  . Social Musicianconnections    Talks on phone: Not on file    Gets together: Not on file    Attends religious service: Not on file    Active member of club or organization: Not on file    Attends meetings of clubs or organizations: Not on file    Relationship status: Not on file  Other Topics Concern  . Not on file  Social History Narrative   Unemployed, no fixed address    Hospital Course:  From admission assessment: Wanda Howard is a 38 y.o. female who [facility] after being found unconscious on the gazebo outside an apartment complex.  Pt endorsed suicide attempt.  Pt is of no fixed address, and she is not followed by an outpatient psychiatric/therapy provider.  Pt provided history. Pt reported that she is very depressed, and  that on 12/20/2018 she tried to commit suicide by intentionally ingesting 3 tabs of remeron and 1/2 tab of Trazodone. Pt endorsed continued suicidal ideation.  She also endorsed persistent despondency, insomnia (about two hours per night), fatigue; isolation; guilt; poor concentration.  Pt denied hallucination, self-injurious behavior, and homicidal ideation.  Pt endorsed daily use of marijuana, and episodic use of cocaine and alcohol. Pt requested inpatient care.  From admission H&P: This is the first admission here but the second overall for this 38 year old homeless individual who had overdosed and was found in public passed out though it was only 3 tablets of mirtazapine and 1/2 tablet of trazodone, she stated at  the time she did want to kill her self but of course this is a nonlethal amount of medication. She acknowledges chronic cannabis dependency, alcohol abuse cocaine abuse and states she does not need detox measures. States she is interested in some type of rehab or housing. She denies wanting to harm herself now she can contract for safety here she denies withdrawal symptoms or thoughts of harming others.  She denies hallucinations.  She is very irritable makes no eye contact but does give me enough of an interview to complete the mental status exam below. Issues of secondary gain prominent.  Wanda Howard was admitted after overdose on 3 Remeron 30 mg tablets and 1/2 tramadol tablet. [facility]. She was started on Seroquel and Neurontin. She participated in group therapy on the unit. She responded well to treatment with no adverse effects reported. She has shown improved mood, affect, sleep and interaction. She denies any SI/HI/AVH and contracts for safety. She is requesting discharge. She will be staying with a friend in [place]. She appears future-oriented, reporting upcoming job interview and stating intent to continue with a support group. She is discharging on the medications listed below. She is provided with prescriptions for medications at discharge. She [facility] (see below). Her friend is picking her up for discharge home.  Physical Findings: AIMS: Facial and Oral Movements Muscles of Facial Expression: None, normal Lips and Perioral Area: None, normal Jaw: None, normal Tongue: None, normal,Extremity Movements Upper (arms, wrists, hands, fingers): None, normal Lower (legs, knees, ankles, toes): None, normal, Trunk Movements Neck, shoulders, hips: None, normal, Overall Severity Severity of abnormal movements (highest score from questions above): None, normal Incapacitation due to abnormal movements: None, normal Patient's awareness of  abnormal movements (rate only patient's report): No Awareness, Dental Status Current problems with teeth and/or dentures?: No Does patient usually wear dentures?: No  CIWA:    COWS:     Musculoskeletal: Strength & Muscle Tone: within normal limits Gait & Station: normal Patient leans: N/A  Psychiatric Specialty Exam: Physical Exam  Nursing note and vitals reviewed. Constitutional: She is oriented to person, place, and time. She appears well-developed and well-nourished.  Cardiovascular: Normal rate.  Respiratory: Effort normal.  Neurological: She is alert and oriented to person, place, and time.    Review of Systems  Constitutional: Negative.   Psychiatric/Behavioral: Positive for depression (stable on medication) and substance abuse. Negative for hallucinations and suicidal ideas. The patient is not nervous/anxious and does not have insomnia.     Blood pressure 106/75, pulse 98, temperature 98 F (36.7 C), temperature source Oral, resp. rate 18, height 4\' 11"  (1.499 m), weight 51.3 kg, SpO2 100 %.Body mass index is 22.82 kg/m.  General Appearance: Casual  Eye Contact:  Good  Speech:  Clear and Coherent and Normal Rate  Volume:  Normal  Mood:  Euthymic  Affect:  Appropriate and Congruent  Thought Process:  Coherent and Goal Directed  Orientation:  Full (Time, Place, and Person)  Thought Content:  Logical  Suicidal Thoughts:  No  Homicidal Thoughts:  No  Memory:  Immediate;   Good Recent;   Good  Judgement:  Intact  Insight:  Fair  Psychomotor Activity:  Normal  Concentration:  Concentration: Good and Attention Span: Good  Recall:  Good  Fund of Knowledge:  Fair  Language:  Good  Akathisia:  No  Handed:  Right  AIMS (if indicated):     Assets:  Communication Skills Desire for Improvement Housing Resilience Social Support  ADL's:  Intact  Cognition:  WNL  Sleep:  Number of Hours: 6.5     Have you used any form of tobacco in the last 30 days? (Cigarettes,  Smokeless Tobacco, Cigars, and/or Pipes): Yes  Has this patient used any form of tobacco in the last 30 days? (Cigarettes, Smokeless Tobacco, Cigars, and/or Pipes)  Yes, A prescription for an FDA-approved tobacco cessation medication was offered at discharge and the patient refused  Blood Alcohol level:  Lab Results  Component Value Date   ETH 24 (H) 12/21/2018    Metabolic Disorder Labs:  No results found for: HGBA1C, MPG No results found for: PROLACTIN No results found for: CHOL, TRIG, HDL, CHOLHDL, VLDL, LDLCALC  See Psychiatric Specialty Exam and Suicide Risk Assessment completed by Attending Physician prior to discharge.  Discharge destination:  Home  Is patient on multiple antipsychotic therapies at discharge:  No   Has Patient had three or more failed trials of antipsychotic monotherapy by history:  No  Recommended Plan for Multiple Antipsychotic Therapies: NA   Allergies as of 12/26/2018      Reactions   Latex Hives   Penicillins Hives   Did it involve swelling of the face/tongue/throat, SOB, or low BP? No Did it involve sudden or severe rash/hives, skin peeling, or any reaction on the inside of your mouth or nose? No Did you need to seek medical attention at a hospital or doctor's office? No When did it last happen? Doesn't recall If all above answers are "NO", may proceed with cephalosporin use.      Medication List    TAKE these medications     Indication  gabapentin 300 MG capsule Commonly known as: NEURONTIN Take 1 capsule (300 mg total) by mouth 3 (three) times daily.  Indication: Abuse or Misuse of Alcohol   medroxyPROGESTERone 150 MG/ML injection Commonly known as: DEPO-PROVERA Inject 150 mg into the muscle every 3 (three) months.  Indication: Birth Control Treatment   QUEtiapine 50 MG tablet Commonly known as: SEROQUEL Take 1 tablet (50 mg total) by mouth at bedtime.  Indication: Agitation      Follow-up Information    [facility] Follow up on 01/02/2019.   Why: Hospital follow up appointment with Scarlette CalicoFrances is Monday, 7/13 at 8:30a.  Please bring your photo ID, insurance card, current medications and discharge paperwork from this hospitalization.  Contact information: [address] [place] [address] [phone]           Follow-up recommendations: Activity as tolerated. Diet as recommended by primary care physician. Keep all scheduled follow-up appointments as recommended.   Comments:   Patient is instructed to take all prescribed medications as recommended. Report any side effects or adverse reactions to your outpatient  psychiatrist. Patient is instructed to abstain from alcohol and illegal drugs while on prescription medications. In the event of worsening symptoms, patient is instructed to call the crisis hotline, 911, or go to the nearest emergency department for evaluation and treatment.  Signed: Connye Burkitt, NP 12/26/2018, 1:25 PM

## 2018-12-26 NOTE — Plan of Care (Signed)
  Problem: Education: Goal: Knowledge of [facility] Education information/materials will improve Outcome: Adequate for Discharge   Problem: Education: Goal: Emotional status will improve Outcome: Adequate for Discharge   Problem: Education: Goal: Mental status will improve Outcome: Adequate for Discharge   Problem: Education: Goal: Verbalization of understanding the information provided will improve Outcome: Adequate for Discharge   

## 2018-12-26 NOTE — Progress Notes (Signed)
  [facility] Adult Case Management Discharge Plan :  Will you be returning to the same living situation after discharge:  No.; with a friend in [place] At discharge, do you have transportation home?: Yes,  pt's friend Do you have the ability to pay for your medications: Yes,  medicaid  Release of information consent forms completed and in the chart;  Patient's signature needed at discharge.  Patient to Follow up at: Follow-up Information    [facility] Follow up.   Why: TEFL teacher attempted to Audiological scientist for appointment. Social Civil engineer, contracting will contact you with the appointment.  Contact information: [place] [phone]           Next level of care provider has access to [facility] and Suicide Prevention discussed: No.; CSW attempted twice with pt's friend and pt's sister  Have you used any form of tobacco in the last 30 days? (Cigarettes, Smokeless Tobacco, Cigars, and/or Pipes): Yes  Has patient been referred to the Quitline?: Patient refused referral  Patient has been referred for addiction treatment: Yes  Trecia Rogers, LCSW 12/26/2018, 11:47 AM

## 2018-12-26 NOTE — [facility] Suicide Risk Assessment (Addendum)
[place] INPATIENT:  Family/Significant Other Suicide Prevention Education  Suicide Prevention Education:  Contact Attempts: Pt's friend, Ernestina Patches and pt's sister, Geralda Baumgardner, has been identified by the patient as the family member/significant other with whom the patient will be residing, and identified as the person(s) who will aid the patient in the event of a mental health crisis.  With written consent from the patient, two attempts were made to provide suicide prevention education, prior to and/or following the patient's discharge.  We were unsuccessful in providing suicide prevention education.  A suicide education pamphlet was given to the patient to share with family/significant other.  Date and time of first attempt: 12/26/2018 @ 10:15am (left HIPPA compliant VM) Date and time of second attempt: 12/26/2018 @ 10:35am  Trecia Rogers 12/26/2018, 10:19 AM

## 2021-02-26 ENCOUNTER — Ambulatory Visit (HOSPITAL_COMMUNITY)
Admission: EM | Admit: 2021-02-26 | Discharge: 2021-02-26 | Disposition: A | Discharge status: Home / Self Care | Payer: Medicaid Other | Attending: Emergency Medicine | Admitting: Emergency Medicine

## 2021-02-26 ENCOUNTER — Encounter (HOSPITAL_COMMUNITY): Payer: Self-pay | Admitting: Emergency Medicine

## 2021-02-26 ENCOUNTER — Other Ambulatory Visit: Payer: Self-pay

## 2021-02-26 ENCOUNTER — Ambulatory Visit: Payer: Self-pay | Admitting: *Deleted

## 2021-02-26 DIAGNOSIS — T63461A Toxic effect of venom of wasps, accidental (unintentional), initial encounter: Secondary | ICD-10-CM

## 2021-02-26 MED ORDER — TRIAMCINOLONE ACETONIDE 0.1 % EX CREA: 1.0000 "application " | TOPICAL_CREAM | Freq: Two times a day (BID) | CUTANEOUS | 0 refills | Status: AC

## 2021-02-26 NOTE — ED Provider Notes (Signed)
[facility]    CSN: [id number] Arrival date & time: 02/26/21  1911      History   Chief Complaint Chief Complaint  Patient presents with   Insect Bite    HPI Wanda Howard is a 40 y.o. female.   40 yr old female pt, Wanda Howard,presents to ER with chief complaint of being stung by yellow jacket 2 days prior, noticed swelling and erythema at site. Ice,abx ointment tried.   The history is provided by the patient. No language interpreter was used.   Past Medical History:  Diagnosis Date   Carpal tunnel syndrome    Depression     Patient Active Problem List   Diagnosis Date Noted   MDD (major depressive disorder), recurrent episode, severe (HCC) 12/22/2018    Past Surgical History:  Procedure Laterality Date   CESAREAN SECTION      OB History   No obstetric history on file.      Home Medications    Prior to Admission medications   Medication Sig Start Date End Date Taking? Authorizing Provider  triamcinolone cream (KENALOG) 0.1 % Apply 1 application topically 2 (two) times daily for 8 days. 02/26/21 03/06/21 Yes Gerica Koble, Para March, NP  gabapentin (NEURONTIN) 300 MG capsule Take 1 capsule (300 mg total) by mouth 3 (three) times daily. 12/26/18   Malvin Johns, MD  medroxyPROGESTERone (DEPO-PROVERA) 150 MG/ML injection Inject 150 mg into the muscle every 3 (three) months.    [provider]  QUEtiapine (SEROQUEL) 50 MG tablet Take 1 tablet (50 mg total) by mouth at bedtime. 12/26/18   Malvin Johns, MD    Family History No family history on file.  Social History Social History   Tobacco Use   Smoking status: Every Day    Packs/day: 1.00    Types: Cigarettes   Smokeless tobacco: Never  Vaping Use   Vaping Use: Never used  Substance Use Topics   Alcohol use: Yes    Comment: BAC was +24   Drug use: Yes    Frequency: 7.0 times per week    Types: Amphetamines, Cocaine, Marijuana    Comment: +Amph; THC; Coc     Allergies   Latex and  Penicillins   Review of Systems Review of Systems  Constitutional:  Negative for fever.  Skin:  Positive for color change and wound.  All other systems reviewed and are negative.   Physical Exam Triage Vital Signs ED Triage Vitals  Enc Vitals Group     BP 02/26/21 1942 (!) 148/94     Pulse Rate 02/26/21 1942 82     Resp 02/26/21 1942 18     Temp 02/26/21 1942 98.4 F (36.9 C)     Temp Source 02/26/21 1942 Oral     SpO2 02/26/21 1942 97 %     Weight --      Height --      Head Circumference --      Peak Flow --      Pain Score 02/26/21 1940 7     Pain Loc --      Pain Edu? --      Excl. in GC? --    No data found.  Updated Vital Signs BP (!) 148/94 (BP Location: Right Arm)   Pulse 82   Temp 98.4 F (36.9 C) (Oral)   Resp 18   SpO2 97%   Visual Acuity Right Eye Distance:   Left Eye Distance:   Bilateral Distance:    Right  Eye Near:   Left Eye Near:    Bilateral Near:     Physical Exam Vitals and nursing note reviewed.  Cardiovascular:     Rate and Rhythm: Normal rate and regular rhythm.     Pulses: Normal pulses.          Dorsalis pedis pulses are 2+ on the right side and 2+ on the left side.  Musculoskeletal:       Feet:  Skin:    Capillary Refill: Capillary refill takes less than 2 seconds.     Findings: Erythema and wound present.       Neurological:     General: No focal deficit present.     Mental Status: She is alert and oriented to person, place, and time.  Psychiatric:        Mood and Affect: Mood normal.        Behavior: Behavior normal. Behavior is cooperative.     UC Treatments / Results  Labs (all labs ordered are listed, but only abnormal results are displayed) Labs Reviewed - No data to display  EKG   Radiology No results found.  Procedures Procedures (including critical care time)  Medications Ordered in UC Medications - No data to display  Initial Impression / Assessment and Plan / UC Course  I have reviewed the  triage vital signs and the nursing notes.  Pertinent labs & imaging results that were available during my care of the patient were reviewed by me and considered in my medical decision making (see chart for details).     Ddx: insect sting, local reaction Final Clinical Impressions(s) / UC Diagnoses   Final diagnoses:  Yellow jacket sting, accidental or unintentional, initial encounter     Discharge Instructions      Rest,ice,elevate, push fluids, take benadryl as label directed for swelling,itching. May apply topical triamcinolone cream  to area as directed.      ED Prescriptions     Medication Sig Dispense Auth. Provider   triamcinolone cream (KENALOG) 0.1 % Apply 1 application topically 2 (two) times daily for 8 days. 15 g Burnette Valenti, Para March, NP      PDMP not reviewed this encounter.   Clancy Gourd, NP 02/26/21 2026

## 2021-02-26 NOTE — Telephone Encounter (Signed)
C/o being stung by a yellow jacket bee day before yesterday to inner right ankle. Ankle swollen numbness, and painful. Area to ankle red and dark in color and swelling is spreading. Denies breathing difficulty, tongue swelling, vomiting. No PCP . Encouraged UC . Care advise given. Patient verbalized understanding of care advise and to go to [facility] or ED if symptoms worsen.

## 2021-02-26 NOTE — ED Triage Notes (Signed)
Pt c/o right ankle bee sting 2 days ago. Reports itching and swelling getting worse

## 2021-02-26 NOTE — Discharge Instructions (Signed)
Rest,ice,elevate, push fluids, take benadryl as label directed for swelling,itching. May apply topical triamcinolone cream  to area as directed.

## 2021-02-26 NOTE — Telephone Encounter (Signed)
Reason for Disposition  Swelling is huge (e.g., more than 4 inches or 10 cm, spreads beyond wrist or ankle)  Answer Assessment - Initial Assessment Questions 1. TYPE: "What type of sting was it?" (bee, yellow jacket, etc.)      Yellow jacket  2. ONSET: "When did it occur?"      Day before yesterday  3. LOCATION: "Where is the sting located?"  "How many stings?"     Inner right ankle  4. SWELLING SIZE: "How big is the swelling?" (e.g., inches or cm)     Overlaps over shoe  5. REDNESS: "Is the area red or pink?" If Yes, ask: "What size is area of redness?" (e.g., inches or cm). "When did the redness start?"     Red and dark 6. PAIN: "Is there any pain?" If Yes, ask: "How bad is it?"  (Scale 1-10; or mild, moderate, severe)     Yes 7-8 /10  7. ITCHING: "Is there any itching?" If Yes, ask: "How bad is it?"      Yes bad  8. RESPIRATORY DISTRESS: "Describe your breathing."     Ok  9. PRIOR REACTIONS: "Have you had any severe allergic reactions to stings in the past?" if yes, ask: "What happened?"     No  10. OTHER SYMPTOMS: "Do you have any other symptoms?" (e.g., abdominal pain, face or tongue swelling, new rash elsewhere, vomiting)       None  11. PREGNANCY: "Is there any chance you are pregnant?" "When was your last menstrual period?"       na  Protocols used: Bee or Yellow Jacket Sting-A-AH

## 2021-05-19 ENCOUNTER — Encounter (HOSPITAL_COMMUNITY): Payer: Self-pay | Admitting: Emergency Medicine

## 2021-05-19 ENCOUNTER — Other Ambulatory Visit: Payer: Self-pay

## 2021-05-19 ENCOUNTER — Emergency Department (HOSPITAL_COMMUNITY)
Admission: EM | Admit: 2021-05-19 | Discharge: 2021-05-20 | Disposition: A | Discharge status: Home / Self Care | Payer: Medicaid Other | Attending: Emergency Medicine | Admitting: Emergency Medicine

## 2021-05-19 DIAGNOSIS — K529 Noninfective gastroenteritis and colitis, unspecified: Secondary | ICD-10-CM | POA: Insufficient documentation

## 2021-05-19 DIAGNOSIS — F17210 Nicotine dependence, cigarettes, uncomplicated: Secondary | ICD-10-CM | POA: Insufficient documentation

## 2021-05-19 DIAGNOSIS — R1084 Generalized abdominal pain: Secondary | ICD-10-CM | POA: Diagnosis present

## 2021-05-19 LAB — I-STAT CHEM 8, ED
BUN: 16 mg/dL (ref 6–20)
Calcium, Ion: 0.96 mmol/L — ABNORMAL LOW (ref 1.15–1.40)
Chloride: 108 mmol/L (ref 98–111)
Creatinine, Ser: 0.9 mg/dL (ref 0.44–1.00)
Glucose, Bld: 125 mg/dL — ABNORMAL HIGH (ref 70–99)
HCT: 52 % — ABNORMAL HIGH (ref 36.0–46.0)
Hemoglobin: 17.7 g/dL — ABNORMAL HIGH (ref 12.0–15.0)
Potassium: 3.4 mmol/L — ABNORMAL LOW (ref 3.5–5.1)
Sodium: 143 mmol/L (ref 135–145)
TCO2: 23 mmol/L (ref 22–32)

## 2021-05-19 LAB — PREGNANCY, URINE: Preg Test, Ur: NEGATIVE

## 2021-05-19 LAB — CBC WITH DIFFERENTIAL/PLATELET
Abs Immature Granulocytes: 0 10*3/uL (ref 0.00–0.07)
Basophils Absolute: 0 10*3/uL (ref 0.0–0.1)
Basophils Relative: 0 %
Eosinophils Absolute: 0 10*3/uL (ref 0.0–0.5)
Eosinophils Relative: 0 %
HCT: 50 % — ABNORMAL HIGH (ref 36.0–46.0)
Hemoglobin: 17.3 g/dL — ABNORMAL HIGH (ref 12.0–15.0)
Lymphocytes Relative: 3 %
Lymphs Abs: 0.8 10*3/uL (ref 0.7–4.0)
MCH: 34.9 pg — ABNORMAL HIGH (ref 26.0–34.0)
MCHC: 34.6 g/dL (ref 30.0–36.0)
MCV: 101 fL — ABNORMAL HIGH (ref 80.0–100.0)
Monocytes Absolute: 0.3 10*3/uL (ref 0.1–1.0)
Monocytes Relative: 1 %
Neutro Abs: 26.4 10*3/uL — ABNORMAL HIGH (ref 1.7–7.7)
Neutrophils Relative %: 96 %
Platelets: 312 10*3/uL (ref 150–400)
RBC: 4.95 MIL/uL (ref 3.87–5.11)
RDW: 14.3 % (ref 11.5–15.5)
WBC: 27.5 10*3/uL — ABNORMAL HIGH (ref 4.0–10.5)
nRBC: 0 % (ref 0.0–0.2)
nRBC: 0 /100 WBC

## 2021-05-19 LAB — COMPREHENSIVE METABOLIC PANEL
ALT: 12 U/L (ref 0–44)
AST: 23 U/L (ref 15–41)
Albumin: 3.9 g/dL (ref 3.5–5.0)
Alkaline Phosphatase: 79 U/L (ref 38–126)
Anion gap: 17 — ABNORMAL HIGH (ref 5–15)
BUN: 14 mg/dL (ref 6–20)
CO2: 21 mmol/L — ABNORMAL LOW (ref 22–32)
Calcium: 9.6 mg/dL (ref 8.9–10.3)
Chloride: 106 mmol/L (ref 98–111)
Creatinine, Ser: 1.23 mg/dL — ABNORMAL HIGH (ref 0.44–1.00)
GFR, Estimated: 57 mL/min — ABNORMAL LOW (ref >60)
Glucose, Bld: 132 mg/dL — ABNORMAL HIGH (ref 70–99)
Potassium: 3.6 mmol/L (ref 3.5–5.1)
Sodium: 144 mmol/L (ref 135–145)
Total Bilirubin: 1.9 mg/dL — ABNORMAL HIGH (ref 0.3–1.2)
Total Protein: 7.9 g/dL (ref 6.5–8.1)

## 2021-05-19 LAB — URINALYSIS, ROUTINE W REFLEX MICROSCOPIC
Bilirubin Urine: NEGATIVE
Glucose, UA: 50 mg/dL — AB
Ketones, ur: 20 mg/dL — AB
Nitrite: NEGATIVE
Protein, ur: 100 mg/dL — AB
Specific Gravity, Urine: 1.023 (ref 1.005–1.030)
pH: 5 (ref 5.0–8.0)

## 2021-05-19 LAB — LIPASE, BLOOD: Lipase: 22 U/L (ref 11–51)

## 2021-05-19 MED ORDER — MORPHINE SULFATE (PF) 2 MG/ML IV SOLN
4.0000 mg | Freq: Once | INTRAVENOUS | Status: AC
  Administered 2021-05-19: 20:00:00 4 mg via INTRAMUSCULAR
  Filled 2021-05-19: qty 2

## 2021-05-19 MED ORDER — ONDANSETRON 4 MG PO TBDP
8.0000 mg | ORAL_TABLET | Freq: Once | ORAL | Status: AC
  Administered 2021-05-19: 20:00:00 8 mg via ORAL
  Filled 2021-05-19: qty 2

## 2021-05-19 NOTE — ED Provider Notes (Signed)
Emergency Medicine Provider Triage Evaluation Note  Wanda Howard , a 40 y.o. female  was evaluated in triage.  Pt complains of diffuse abdominal pain, primarily in the left lower quadrant.  Started yesterday, constant and associated with nausea and vomiting.  History of 2 prior C-sections denies any other abdominal procedures..  Review of Systems  Positive: Abdominal pain, nausea, vomiting Negative: Dysuria, hematuria  Physical Exam  BP 132/86   Pulse (!) 106   Temp 98.1 F (36.7 C) (Oral)   Resp 16   Ht 4\' 11"  (1.499 m)   Wt 44.5 kg   SpO2 99%   BMI 19.79 kg/m  Gen:   Patient appears uncomfortable Resp:  Normal effort  MSK:   Moves extremities without difficulty  Other:  Diffuse abdominal tenderness with guarding.  Right CVA tenderness.  Medical Decision Making  Medically screening exam initiated at 7:48 PM.  Appropriate orders placed.  Wanda Howard was informed that the remainder of the evaluation will be completed by another provider, this initial triage assessment does not replace that evaluation, and the importance of remaining in the ED until their evaluation is complete.  Labs, CT   , Wanda Howard 05/19/21 1950    05/21/21, MD 05/20/21 [phone]

## 2021-05-19 NOTE — ED Triage Notes (Signed)
Pt BIB [facility] from home, c/o lower abdominal pain that started last night, nausea/vomiting started today.

## 2021-05-20 ENCOUNTER — Emergency Department (HOSPITAL_COMMUNITY): Payer: Medicaid Other

## 2021-05-20 MED ORDER — HALOPERIDOL LACTATE 5 MG/ML IJ SOLN
INTRAMUSCULAR | Status: AC
  Filled 2021-05-20: qty 1

## 2021-05-20 MED ORDER — SODIUM CHLORIDE 0.9 % IV BOLUS
1000.0000 mL | Freq: Once | INTRAVENOUS | Status: AC
  Administered 2021-05-20: 1000 mL via INTRAVENOUS

## 2021-05-20 MED ORDER — LACTATED RINGERS IV BOLUS: 1000.0000 mL | Freq: Once | INTRAVENOUS | Status: DC

## 2021-05-20 MED ORDER — ONDANSETRON 4 MG PO TBDP: 4.0000 mg | ORAL_TABLET | Freq: Three times a day (TID) | ORAL | 0 refills | Status: AC | PRN

## 2021-05-20 MED ORDER — HYDROMORPHONE HCL 1 MG/ML IJ SOLN
INTRAMUSCULAR | Status: AC
  Filled 2021-05-20: qty 1

## 2021-05-20 MED ORDER — HALOPERIDOL LACTATE 5 MG/ML IJ SOLN
2.0000 mg | Freq: Once | INTRAMUSCULAR | Status: AC
  Administered 2021-05-20: 2 mg via INTRAVENOUS

## 2021-05-20 MED ORDER — IOHEXOL 350 MG/ML SOLN
75.0000 mL | Freq: Once | INTRAVENOUS | Status: AC | PRN
  Administered 2021-05-20: 75 mL via INTRAVENOUS

## 2021-05-20 MED ORDER — HYDROMORPHONE HCL 1 MG/ML IJ SOLN
0.5000 mg | Freq: Once | INTRAMUSCULAR | Status: AC
  Administered 2021-05-20: 0.5 mg via INTRAVENOUS

## 2021-05-20 NOTE — ED Provider Notes (Addendum)
[facility] Emergency Department Provider Note MRN:  [id number]  Arrival date & time: 05/20/21     Chief Complaint   Abdominal Pain   History of Present Illness   Wanda Howard is a 40 y.o. year-old female with a history of depression presenting to the ED with chief complaint of abdominal pain.  Location:  diffuse Duration:  1 week Onset:  gradual Timing:  constant Description:  ache Severity:  severe Exacerbating/Alleviating Factors:  none Associated Symptoms:  nausea, vomiting, fever, diarrhea Pertinent Negatives: No chest pain or shortness of breath   Review of Systems  A complete 10 system review of systems was obtained and all systems are negative except as noted in the HPI and PMH.   Patient's Health History    Past Medical History:  Diagnosis Date   Carpal tunnel syndrome    Depression     Past Surgical History:  Procedure Laterality Date   CESAREAN SECTION      History reviewed. No pertinent family history.  Social History   Socioeconomic History   Marital status: Single    Spouse name: Not on file   Number of children: Not on file   Years of education: Not on file   Highest education level: Not on file  Occupational History   Occupation: Unemployed  Tobacco Use   Smoking status: Every Day    Packs/day: 1.00    Types: Cigarettes   Smokeless tobacco: Never  Vaping Use   Vaping Use: Never used  Substance and Sexual Activity   Alcohol use: Yes    Comment: BAC was +24   Drug use: Yes    Frequency: 7.0 times per week    Types: Amphetamines, Cocaine, Marijuana    Comment: +Amph; THC; Coc   Sexual activity: Not Currently    Birth control/protection: Injection  Other Topics Concern   Not on file  Social History Narrative   Unemployed, no fixed address   Social Determinants of Health   Financial Resource Strain: Not on file  Food Insecurity: Not on file  Transportation Needs: Not on file  Physical Activity: Not on  file  Stress: Not on file  Social Connections: Not on file  Intimate Partner Violence: Not on file     Physical Exam   Vitals:   05/20/21 0035 05/20/21 0428  BP: 133/85 115/89  Pulse: 85 67  Resp: 17 17  Temp: 97.9 F (36.6 C) 97.7 F (36.5 C)  SpO2: 98% 98%    CONSTITUTIONAL: Chronically ill-appearing, in moderate distress due to discomfort NEURO:  Alert and oriented x 3, no focal deficits EYES:  eyes equal and reactive ENT/NECK:  no LAD, no JVD CARDIO: Regular rate, well-perfused, normal S1 and S2 PULM:  CTAB no wheezing or rhonchi GI/GU: Diffusely tender with rigidity noted MSK/SPINE:  No gross deformities, no edema SKIN:  no rash, atraumatic PSYCH:  Appropriate speech and behavior  *Additional and/or pertinent findings included in MDM below  Diagnostic and Interventional Summary    EKG Interpretation  Date/Time:    Ventricular Rate:    PR Interval:    QRS Duration:   QT Interval:    QTC Calculation:   R Axis:     Text Interpretation:         Labs Reviewed  CBC WITH DIFFERENTIAL/PLATELET - Abnormal; Notable for the following components:      Result Value   WBC 27.5 (*)    Hemoglobin 17.3 (*)    HCT 50.0 (*)  MCV 101.0 (*)    MCH 34.9 (*)    Neutro Abs 26.4 (*)    All other components within normal limits  COMPREHENSIVE METABOLIC PANEL - Abnormal; Notable for the following components:   CO2 21 (*)    Glucose, Bld 132 (*)    Creatinine, Ser 1.23 (*)    Total Bilirubin 1.9 (*)    GFR, Estimated 57 (*)    Anion gap 17 (*)    All other components within normal limits  URINALYSIS, ROUTINE W REFLEX MICROSCOPIC - Abnormal; Notable for the following components:   Color, Urine AMBER (*)    APPearance HAZY (*)    Glucose, UA 50 (*)    Hgb urine dipstick SMALL (*)    Ketones, ur 20 (*)    Protein, ur 100 (*)    Leukocytes,Ua TRACE (*)    Bacteria, UA FEW (*)    All other components within normal limits  I-STAT CHEM 8, ED - Abnormal; Notable for the  following components:   Potassium 3.4 (*)    Glucose, Bld 125 (*)    Calcium, Ion 0.96 (*)    Hemoglobin 17.7 (*)    HCT 52.0 (*)    All other components within normal limits  LIPASE, BLOOD  PREGNANCY, URINE    CT Abdomen Pelvis W Contrast  Final Result      Medications  ondansetron (ZOFRAN-ODT) disintegrating tablet 8 mg (8 mg Oral Given 05/19/21 1953)  morphine 2 MG/ML injection 4 mg (4 mg Intramuscular Given 05/19/21 1954)  iohexol (OMNIPAQUE) 350 MG/ML injection 75 mL (75 mLs Intravenous Contrast Given 05/20/21 0149)  haloperidol lactate (HALDOL) injection 2 mg (2 mg Intravenous Given 05/20/21 0542)  sodium chloride 0.9 % bolus 1,000 mL (1,000 mLs Intravenous New Bag/Given 05/20/21 0541)  HYDROmorphone (DILAUDID) injection 0.5 mg (0.5 mg Intravenous Given 05/20/21 0601)     Procedures  /  Critical Care Procedures  ED Course and Medical Decision Making  I have reviewed the triage vital signs, the nursing notes, and pertinent available records from the EMR.  Listed above are laboratory and imaging tests that I personally ordered, reviewed, and interpreted and then considered in my medical decision making (see below for details).  Significant abdominal pain with nausea vomiting diarrhea, tender with rigidity, leukocytosis, CT demonstrating an enteritis.  Providing further symptomatic management and will reassess.  Patient with continued pain and nausea, providing further pain medicine, fluids, will need reassessment.  May need admission for symptom control.  Patient doing much better, sleeping comfortably, wakes easily, symptoms largely resolved.  Abdomen is soft and nontender.  Seems appropriate for discharge with symptomatic management at home.  [name]. Pilar Plate, MD [facility] Emergency Medicine [facility] [email or web address]  Final Clinical Impressions(s) / ED Diagnoses     ICD-10-CM   1. Enteritis  K52.9       ED Discharge Orders           Ordered    ondansetron (ZOFRAN-ODT) 4 MG disintegrating tablet  Every 8 hours PRN        05/20/21 0714             Discharge Instructions Discussed with and Provided to Patient:     Discharge Instructions      You were evaluated in the Emergency Department and after careful evaluation, we did not find any emergent condition requiring admission or further testing in the hospital.  Your exam/testing today was overall reassuring.  Symptoms seem to be due  to an enteritis or infection of the intestines due to a virus.  Can use the Zofran medication as needed for nausea, drink plenty of fluids at home.  Please return to the Emergency Department if you experience any worsening of your condition.  Thank you for allowing [place] to be a part of your care.         Sabas Sous, MD 05/20/21 2549    Sabas Sous, MD 05/20/21 [phone]

## 2021-05-20 NOTE — Discharge Instructions (Signed)
You were evaluated in the Emergency Department and after careful evaluation, we did not find any emergent condition requiring admission or further testing in the hospital.  Your exam/testing today was overall reassuring.  Symptoms seem to be due to an enteritis or infection of the intestines due to a virus.  Can use the Zofran medication as needed for nausea, drink plenty of fluids at home.  Please return to the Emergency Department if you experience any worsening of your condition.  Thank you for allowing [place] to be a part of your care.

## 2021-05-23 ENCOUNTER — Encounter (HOSPITAL_COMMUNITY): Payer: Self-pay

## 2021-05-23 ENCOUNTER — Ambulatory Visit: Payer: Self-pay

## 2021-05-23 ENCOUNTER — Other Ambulatory Visit: Payer: Self-pay

## 2021-05-23 ENCOUNTER — Inpatient Hospital Stay (HOSPITAL_COMMUNITY)
Admission: EM | Admit: 2021-05-23 | Discharge: 2021-05-25 | DRG: 684 | Disposition: A | Discharge status: Home / Self Care | Payer: Medicaid Other | Attending: Internal Medicine | Admitting: Internal Medicine

## 2021-05-23 DIAGNOSIS — F1910 Other psychoactive substance abuse, uncomplicated: Secondary | ICD-10-CM | POA: Diagnosis not present

## 2021-05-23 DIAGNOSIS — Z880 Allergy status to penicillin: Secondary | ICD-10-CM

## 2021-05-23 DIAGNOSIS — N179 Acute kidney failure, unspecified: Principal | ICD-10-CM | POA: Diagnosis present

## 2021-05-23 DIAGNOSIS — Z91040 Latex allergy status: Secondary | ICD-10-CM

## 2021-05-23 DIAGNOSIS — Z20822 Contact with and (suspected) exposure to covid-19: Secondary | ICD-10-CM | POA: Diagnosis present

## 2021-05-23 DIAGNOSIS — F419 Anxiety disorder, unspecified: Secondary | ICD-10-CM | POA: Diagnosis present

## 2021-05-23 DIAGNOSIS — F17210 Nicotine dependence, cigarettes, uncomplicated: Secondary | ICD-10-CM | POA: Diagnosis present

## 2021-05-23 DIAGNOSIS — F32A Depression, unspecified: Secondary | ICD-10-CM | POA: Diagnosis present

## 2021-05-23 DIAGNOSIS — K529 Noninfective gastroenteritis and colitis, unspecified: Secondary | ICD-10-CM | POA: Diagnosis not present

## 2021-05-23 DIAGNOSIS — E876 Hypokalemia: Secondary | ICD-10-CM | POA: Diagnosis not present

## 2021-05-23 DIAGNOSIS — K5900 Constipation, unspecified: Secondary | ICD-10-CM | POA: Diagnosis present

## 2021-05-23 DIAGNOSIS — Z79899 Other long term (current) drug therapy: Secondary | ICD-10-CM

## 2021-05-23 DIAGNOSIS — Z560 Unemployment, unspecified: Secondary | ICD-10-CM

## 2021-05-23 HISTORY — DX: Anxiety disorder, unspecified: F41.9

## 2021-05-23 LAB — COMPREHENSIVE METABOLIC PANEL
ALT: 10 U/L (ref 0–44)
AST: 13 U/L — ABNORMAL LOW (ref 15–41)
Albumin: 3.4 g/dL — ABNORMAL LOW (ref 3.5–5.0)
Alkaline Phosphatase: 64 U/L (ref 38–126)
Anion gap: 14 (ref 5–15)
BUN: 59 mg/dL — ABNORMAL HIGH (ref 6–20)
CO2: 25 mmol/L (ref 22–32)
Calcium: 9.3 mg/dL (ref 8.9–10.3)
Chloride: 96 mmol/L — ABNORMAL LOW (ref 98–111)
Creatinine, Ser: 2.58 mg/dL — ABNORMAL HIGH (ref 0.44–1.00)
GFR, Estimated: 24 mL/min — ABNORMAL LOW (ref >60)
Glucose, Bld: 121 mg/dL — ABNORMAL HIGH (ref 70–99)
Potassium: 3.6 mmol/L (ref 3.5–5.1)
Sodium: 135 mmol/L (ref 135–145)
Total Bilirubin: 0.8 mg/dL (ref 0.3–1.2)
Total Protein: 8.3 g/dL — ABNORMAL HIGH (ref 6.5–8.1)

## 2021-05-23 LAB — URINALYSIS, ROUTINE W REFLEX MICROSCOPIC
Bilirubin Urine: NEGATIVE
Cellular Cast, UA: 3
Glucose, UA: 50 mg/dL — AB
Ketones, ur: 5 mg/dL — AB
Leukocytes,Ua: NEGATIVE
Nitrite: NEGATIVE
Protein, ur: 100 mg/dL — AB
Specific Gravity, Urine: 1.015 (ref 1.005–1.030)
pH: 6 (ref 5.0–8.0)

## 2021-05-23 LAB — CBC
HCT: 48.6 % — ABNORMAL HIGH (ref 36.0–46.0)
Hemoglobin: 17 g/dL — ABNORMAL HIGH (ref 12.0–15.0)
MCH: 34.3 pg — ABNORMAL HIGH (ref 26.0–34.0)
MCHC: 35 g/dL (ref 30.0–36.0)
MCV: 98 fL (ref 80.0–100.0)
Platelets: 286 10*3/uL (ref 150–400)
RBC: 4.96 MIL/uL (ref 3.87–5.11)
RDW: 14.6 % (ref 11.5–15.5)
WBC: 11.8 10*3/uL — ABNORMAL HIGH (ref 4.0–10.5)
nRBC: 0 % (ref 0.0–0.2)

## 2021-05-23 LAB — I-STAT BETA HCG BLOOD, ED (MC, WL, AP ONLY): I-stat hCG, quantitative: 12.3 m[IU]/mL — ABNORMAL HIGH (ref <5)

## 2021-05-23 LAB — HCG, QUANTITATIVE, PREGNANCY: hCG, Beta Chain, Quant, S: 2 m[IU]/mL (ref <5)

## 2021-05-23 LAB — RESP PANEL BY RT-PCR (FLU A&B, COVID) ARPGX2
Influenza A by PCR: NEGATIVE
Influenza B by PCR: NEGATIVE
SARS Coronavirus 2 by RT PCR: NEGATIVE

## 2021-05-23 LAB — LIPASE, BLOOD: Lipase: 22 U/L (ref 11–51)

## 2021-05-23 MED ORDER — HYDROCODONE-ACETAMINOPHEN 5-325 MG PO TABS
1.0000 | ORAL_TABLET | Freq: Once | ORAL | Status: AC
  Administered 2021-05-23: 1 via ORAL
  Filled 2021-05-23: qty 1

## 2021-05-23 MED ORDER — METOCLOPRAMIDE HCL 5 MG/ML IJ SOLN
10.0000 mg | Freq: Once | INTRAMUSCULAR | Status: AC
  Administered 2021-05-23: 10 mg via INTRAVENOUS
  Filled 2021-05-23: qty 2

## 2021-05-23 MED ORDER — MORPHINE SULFATE (PF) 2 MG/ML IV SOLN
1.0000 mg | INTRAVENOUS | Status: DC | PRN
  Administered 2021-05-23 – 2021-05-24 (×2): 1 mg via INTRAVENOUS
  Filled 2021-05-23 (×2): qty 1

## 2021-05-23 MED ORDER — SODIUM CHLORIDE 0.9 % IV BOLUS
1000.0000 mL | Freq: Once | INTRAVENOUS | Status: DC
  Administered 2021-05-23: 1000 mL via INTRAVENOUS

## 2021-05-23 MED ORDER — ENOXAPARIN SODIUM 30 MG/0.3ML IJ SOSY
30.0000 mg | PREFILLED_SYRINGE | INTRAMUSCULAR | Status: DC
  Administered 2021-05-23 – 2021-05-24 (×2): 30 mg via SUBCUTANEOUS
  Filled 2021-05-23 (×2): qty 0.3

## 2021-05-23 MED ORDER — LACTATED RINGERS IV SOLN: INTRAVENOUS | Status: AC

## 2021-05-23 MED ORDER — ONDANSETRON HCL 4 MG/2ML IJ SOLN
4.0000 mg | Freq: Four times a day (QID) | INTRAMUSCULAR | Status: DC | PRN
  Administered 2021-05-23 – 2021-05-24 (×3): 4 mg via INTRAVENOUS
  Filled 2021-05-23 (×3): qty 2

## 2021-05-23 MED ORDER — LACTATED RINGERS IV BOLUS
1000.0000 mL | Freq: Once | INTRAVENOUS | Status: AC
  Administered 2021-05-23: 1000 mL via INTRAVENOUS

## 2021-05-23 NOTE — ED Triage Notes (Addendum)
Per EMS- patient c/o nausea. Patient was seen at [facility] ED 3 days ago and states she was prescribed Zofran, but is not working.   Patient added that she is also having abdominal pain.

## 2021-05-23 NOTE — H&P (Signed)
History and Physical    Wanda Howard MWU:[id number] DOB: 1980-11-04 DOA: 05/23/2021  PCP: Patient, No Pcp Per (Inactive)  Patient coming from: Home  I have personally briefly reviewed patient's old medical records in [facility]  Chief Complaint: Abdominal pain, persistent nausea and vomiting  HPI: Wanda Howard is a 40 y.o. female with medical history significant for depression who presents with concerns of abdominal pain, nausea and vomiting.  Patient has been having the symptoms for about 5 days.  She initially presented to [facility] on 11/29 and was noted to have enteritis on CT abdomen and pelvis.  She was discharged home with Zofran but has been unable to keep it down.  Continues to have daily nausea and vomiting.  Abdominal pain also worsened today. Has felt hot but no objective fever. No diarrhea. No dysuria, urgency or frequency.   ED Course: She had a temperature of 90 9.102F, mildly hypertensive with BP of 158/89 on room air.  WBC of 11.8, hemoglobin 17/hematocrit 48.6.  Sodium 135, K of 3.6, creatinine of 2.58 from prior of 0.9.  BG of 121.    UA is negative.  Review of Systems: Constitutional: No Weight Change, +subjective fever ENT/Mouth: No sore throat, No Rhinorrhea Eyes: No Vision Changes Cardiovascular: No Chest Pain, no SOB Respiratory: No Cough, No Sputum Gastrointestinal: + Nausea, + Vomiting, No Diarrhea, No Constipation, + Pain Genitourinary: no Urinary Incontinence, No Urgency, No Flank Pain Musculoskeletal:No Myalgias Skin: No Skin Lesions,  Neuro: no Weakness, No Numbness Psych: + decrease appetite Heme/Lymph: No Bruising, No Bleeding  Social Patient currently unemployed.  Reports half a pack of tobacco use daily.  Occasional alcohol.  She used cocaine about 2 to 3 days ago.   Past Medical History:  Diagnosis Date   Anxiety    Carpal tunnel syndrome    Depression     Past Surgical History:  Procedure Laterality Date   CESAREAN  SECTION        Allergies  Allergen Reactions   Latex Hives   Penicillins Hives    Did it involve swelling of the face/tongue/throat, SOB, or low BP? No Did it involve sudden or severe rash/hives, skin peeling, or any reaction on the inside of your mouth or nose? No Did you need to seek medical attention at a hospital or doctor's office? No When did it last happen? Doesn't recall       If all above answers are "NO", may proceed with cephalosporin use.     History reviewed. No pertinent family history.   Prior to Admission medications   Medication Sig Start Date End Date Taking? Authorizing Provider  gabapentin (NEURONTIN) 300 MG capsule Take 1 capsule (300 mg total) by mouth 3 (three) times daily. 12/26/18   Malvin Johns, MD  medroxyPROGESTERone (DEPO-PROVERA) 150 MG/ML injection Inject 150 mg into the muscle every 3 (three) months.    [provider]  ondansetron (ZOFRAN-ODT) 4 MG disintegrating tablet Take 1 tablet (4 mg total) by mouth every 8 (eight) hours as needed for nausea or vomiting. 05/20/21   Sabas Sous, MD  QUEtiapine (SEROQUEL) 50 MG tablet Take 1 tablet (50 mg total) by mouth at bedtime. 12/26/18   Malvin Johns, MD    Physical Exam: Vitals:   05/23/21 1439 05/23/21 1443 05/23/21 1444 05/23/21 1859  BP:   (!) 158/89 (!) 156/91  Pulse:   83 62  Resp:   16 (!) 26  Temp:   99.8 F (37.7 C)  TempSrc:   Oral   SpO2: 100%  100% 99%  Weight:  44.5 kg    Height:  4\' 11"  (1.499 m)      Constitutional: NAD, calm, comfortable, nontoxic appearing middle-age female laying in bed Vitals:   05/23/21 1439 05/23/21 1443 05/23/21 1444 05/23/21 1859  BP:   (!) 158/89 (!) 156/91  Pulse:   83 62  Resp:   16 (!) 26  Temp:   99.8 F (37.7 C)   TempSrc:   Oral   SpO2: 100%  100% 99%  Weight:  44.5 kg    Height:  4\' 11"  (1.499 m)     Eyes: PERRL, lids and conjunctivae normal ENMT: Mucous membranes are moist.  Neck: normal, supple, Respiratory: clear to  auscultation bilaterally, no wheezing, no crackles. Normal respiratory effort.  Cardiovascular: Regular rate and rhythm, no murmurs / rubs / gallops. No extremity edema.  Abdomen: non-Distended, no tenderness, no masses palpated. Bowel sounds positive.  Musculoskeletal: no clubbing / cyanosis.Good ROM, no contractures. Normal muscle tone.  Skin: no rashes, lesions, ulcers.  Neurologic: CN 2-12 grossly intact.  Psychiatric: Normal judgment and insight. Alert and oriented x 3. Normal mood.     Labs on Admission: I have personally reviewed following labs and imaging studies  CBC: Recent Labs  Lab 05/19/21 1954 05/19/21 2119 05/23/21 1503  WBC 27.5*  --  11.8*  NEUTROABS 26.4*  --   --   HGB 17.3* 17.7* 17.0*  HCT 50.0* 52.0* 48.6*  MCV 101.0*  --  98.0  PLT 312  --  [id number]   Basic Metabolic Panel: Recent Labs  Lab 05/19/21 1954 05/19/21 2119 05/23/21 1503  NA 144 143 135  K 3.6 3.4* 3.6  CL 106 108 96*  CO2 21*  --  25  GLUCOSE 132* 125* 121*  BUN 14 16 59*  CREATININE 1.23* 0.90 2.58*  CALCIUM 9.6  --  9.3   GFR: Estimated Creatinine Clearance: 20 mL/min (A) (by C-G formula based on SCr of 2.58 mg/dL (H)). Liver Function Tests: Recent Labs  Lab 05/19/21 1954 05/23/21 1503  AST 23 13*  ALT 12 10  ALKPHOS 79 64  BILITOT 1.9* 0.8  PROT 7.9 8.3*  ALBUMIN 3.9 3.4*   Recent Labs  Lab 05/19/21 1954 05/23/21 1503  LIPASE 22 22   No results for input(s): AMMONIA in the last 168 hours. Coagulation Profile: No results for input(s): INR, PROTIME in the last 168 hours. Cardiac Enzymes: No results for input(s): CKTOTAL, CKMB, CKMBINDEX, TROPONINI in the last 168 hours. BNP (last 3 results) No results for input(s): PROBNP in the last 8760 hours. HbA1C: No results for input(s): HGBA1C in the last 72 hours. CBG: No results for input(s): GLUCAP in the last 168 hours. Lipid Profile: No results for input(s): CHOL, HDL, LDLCALC, TRIG, CHOLHDL, LDLDIRECT in the last 72  hours. Thyroid Function Tests: No results for input(s): TSH, T4TOTAL, FREET4, T3FREE, THYROIDAB in the last 72 hours. Anemia Panel: No results for input(s): VITAMINB12, FOLATE, FERRITIN, TIBC, IRON, RETICCTPCT in the last 72 hours. Urine analysis:    Component Value Date/Time   COLORURINE YELLOW 05/23/2021 1643   APPEARANCEUR HAZY (A) 05/23/2021 1643   LABSPEC 1.015 05/23/2021 1643   PHURINE 6.0 05/23/2021 1643   GLUCOSEU 50 (A) 05/23/2021 1643   HGBUR MODERATE (A) 05/23/2021 1643   BILIRUBINUR NEGATIVE 05/23/2021 1643   KETONESUR 5 (A) 05/23/2021 1643   PROTEINUR 100 (A) 05/23/2021 1643   NITRITE NEGATIVE 05/23/2021 1643  LEUKOCYTESUR NEGATIVE 05/23/2021 1643    Radiological Exams on Admission: No results found.    Assessment/Plan  Gastroenteritis -Full liquid diet for bowel rest.  Advance as tolerated. -PRN Zofran IV for nausea/vomiting -PRN IV morphine for pain   AKI -Creatinine of 2.58 from prior of 0.9. -IV fluid resuscitation with LR overnight - Avoid nephrotoxic agent or contrasted study.  Follow with repeat in the morning.  Polysubstance use  patient endorsed tobacco, alcohol and cocaine  DVT prophylaxis:.Lovenox Code Status: Full Family Communication: Plan discussed with patient at bedside  disposition Plan: Home with observation Consults called:  Admission status: Observation Level of care: Med-Surg  Status is: Observation  The patient remains OBS appropriate and will d/c before 2 midnights.        Orene Desanctis DO [facility]   If 7PM-7AM, please contact night-coverage [email or web address]   05/23/2021, 9:09 PM

## 2021-05-23 NOTE — ED Provider Notes (Signed)
[place] DEPT Provider Note   CSN: [id number] Arrival date & time: 05/23/21  1435     History Chief Complaint  Patient presents with   Nausea   Abdominal Pain    Wanda Howard is a 40 y.o. female.  Patient states that she has been vomiting for a number days.  She went to [facility] couple days ago and had CT scan that showed enteritis.  She seems to be getting worse.  Continuing with vomiting and cramping  The history is provided by the patient and medical records.  Abdominal Pain Pain location:  Generalized Pain quality: aching   Pain radiates to:  Does not radiate Pain severity:  Moderate Onset quality:  Sudden Timing:  Constant Progression:  Waxing and waning Chronicity:  Recurrent Context: not alcohol use   Associated symptoms: vomiting   Associated symptoms: no chest pain, no cough, no diarrhea, no fatigue and no hematuria       Past Medical History:  Diagnosis Date   Anxiety    Carpal tunnel syndrome    Depression     Patient Active Problem List   Diagnosis Date Noted   AKI (acute kidney injury) ([place]) 05/23/2021   MDD (major depressive disorder), recurrent episode, severe ([place]) 12/22/2018    Past Surgical History:  Procedure Laterality Date   CESAREAN SECTION       OB History   No obstetric history on file.     History reviewed. No pertinent family history.  Social History   Tobacco Use   Smoking status: Every Day    Packs/day: 1.00    Types: Cigarettes   Smokeless tobacco: Never  Vaping Use   Vaping Use: Never used  Substance Use Topics   Alcohol use: Yes   Drug use: Yes    Frequency: 7.0 times per week    Types: Amphetamines, Cocaine, Marijuana    Comment: +Amph; THC; Coc    Home Medications Prior to Admission medications   Medication Sig Start Date End Date Taking? Authorizing Provider  gabapentin (NEURONTIN) 300 MG capsule Take 1 capsule (300 mg total) by mouth 3 (three) times daily. 12/26/18    Johnn Hai, MD  medroxyPROGESTERone (DEPO-PROVERA) 150 MG/ML injection Inject 150 mg into the muscle every 3 (three) months.    [provider]  ondansetron (ZOFRAN-ODT) 4 MG disintegrating tablet Take 1 tablet (4 mg total) by mouth every 8 (eight) hours as needed for nausea or vomiting. 05/20/21   Maudie Flakes, MD  QUEtiapine (SEROQUEL) 50 MG tablet Take 1 tablet (50 mg total) by mouth at bedtime. 12/26/18   Johnn Hai, MD    Allergies    Latex and Penicillins  Review of Systems   Review of Systems  Constitutional:  Negative for appetite change and fatigue.  HENT:  Negative for congestion, ear discharge and sinus pressure.   Eyes:  Negative for discharge.  Respiratory:  Negative for cough.   Cardiovascular:  Negative for chest pain.  Gastrointestinal:  Positive for abdominal pain and vomiting. Negative for diarrhea.  Genitourinary:  Negative for frequency and hematuria.  Musculoskeletal:  Negative for back pain.  Skin:  Negative for rash.  Neurological:  Negative for seizures and headaches.  Psychiatric/Behavioral:  Negative for hallucinations.    Physical Exam Updated Vital Signs BP (!) 158/89 (BP Location: Left Arm)   Pulse 83   Temp 99.8 F (37.7 C) (Oral)   Resp 16   Ht 4\' 11"  (1.499 m)   Wt 44.5  kg   SpO2 100%   BMI 19.80 kg/m   Physical Exam Vitals and nursing note reviewed.  Constitutional:      Appearance: She is well-developed.  HENT:     Head: Normocephalic.     Nose: Nose normal.  Eyes:     General: No scleral icterus.    Conjunctiva/sclera: Conjunctivae normal.  Neck:     Thyroid: No thyromegaly.  Cardiovascular:     Rate and Rhythm: Normal rate and regular rhythm.     Heart sounds: No murmur heard.   No friction rub. No gallop.  Pulmonary:     Breath sounds: No stridor. No wheezing or rales.  Chest:     Chest wall: No tenderness.  Abdominal:     General: There is no distension.     Tenderness: There is no abdominal tenderness.  There is no rebound.  Musculoskeletal:        General: Normal range of motion.     Cervical back: Neck supple.  Lymphadenopathy:     Cervical: No cervical adenopathy.  Skin:    Findings: No erythema or rash.  Neurological:     Mental Status: She is alert and oriented to person, place, and time.     Motor: No abnormal muscle tone.     Coordination: Coordination normal.  Psychiatric:        Behavior: Behavior normal.    ED Results / Procedures / Treatments   Labs (all labs ordered are listed, but only abnormal results are displayed) Labs Reviewed  COMPREHENSIVE METABOLIC PANEL - Abnormal; Notable for the following components:      Result Value   Chloride 96 (*)    Glucose, Bld 121 (*)    BUN 59 (*)    Creatinine, Ser 2.58 (*)    Total Protein 8.3 (*)    Albumin 3.4 (*)    AST 13 (*)    GFR, Estimated 24 (*)    All other components within normal limits  CBC - Abnormal; Notable for the following components:   WBC 11.8 (*)    Hemoglobin 17.0 (*)    HCT 48.6 (*)    MCH 34.3 (*)    All other components within normal limits  URINALYSIS, ROUTINE W REFLEX MICROSCOPIC - Abnormal; Notable for the following components:   APPearance HAZY (*)    Glucose, UA 50 (*)    Hgb urine dipstick MODERATE (*)    Ketones, ur 5 (*)    Protein, ur 100 (*)    Bacteria, UA RARE (*)    All other components within normal limits  I-STAT BETA HCG BLOOD, ED ([facility], [facility], [facility] ONLY) - Abnormal; Notable for the following components:   I-stat hCG, quantitative 12.3 (*)    All other components within normal limits  LIPASE, BLOOD  HCG, QUANTITATIVE, PREGNANCY  HCG, QUANTITATIVE, PREGNANCY    EKG None  Radiology No results found.  Procedures Procedures   Medications Ordered in ED Medications  metoCLOPramide (REGLAN) injection 10 mg (has no administration in time range)  HYDROcodone-acetaminophen (NORCO/VICODIN) 5-325 MG per tablet 1 tablet (has no administration in time range)  lactated ringers  bolus 1,000 mL (has no administration in time range)    ED Course  I have reviewed the triage vital signs and the nursing notes.  Pertinent labs & imaging results that were available during my care of the patient were reviewed by me and considered in my medical decision making (see chart for details).    MDM  Rules/Calculators/A&P                           Patient with enteritis and significant AKI.  She will be admitted to medicine Final Clinical Impression(s) / ED Diagnoses Final diagnoses:  None    Rx / DC Orders ED Discharge Orders     None        Bethann Berkshire, MD 05/23/21 1900

## 2021-05-23 NOTE — Telephone Encounter (Signed)
Pt. Reports she is short of breath and having heart palpitations. "I can hardly talk." Instructed to call 911 now. Verbalizes understanding.     Reason for Disposition  Sounds like a life-threatening emergency to the triager  Answer Assessment - Initial Assessment Questions 1. RESPIRATORY STATUS: "Describe your breathing?" (e.g., wheezing, shortness of breath, unable to speak, severe coughing)      Shortness or breath 2. ONSET: "When did this breathing problem begin?"      Today 3. PATTERN "Does the difficult breathing come and go, or has it been constant since it started?"      Constant 4. SEVERITY: "How bad is your breathing?" (e.g., mild, moderate, severe)    - MILD: No SOB at rest, mild SOB with walking, speaks normally in sentences, can lie down, no retractions, pulse < 100.    - MODERATE: SOB at rest, SOB with minimal exertion and prefers to sit, cannot lie down flat, speaks in phrases, mild retractions, audible wheezing, pulse 100-120.    - SEVERE: Very SOB at rest, speaks in single words, struggling to breathe, sitting hunched forward, retractions, pulse > 120      Moderate 5. RECURRENT SYMPTOM: "Have you had difficulty breathing before?" If Yes, ask: "When was the last time?" and "What happened that time?"      No 6. CARDIAC HISTORY: "Do you have any history of heart disease?" (e.g., heart attack, angina, bypass surgery, angioplasty)      No 7. LUNG HISTORY: "Do you have any history of lung disease?"  (e.g., pulmonary embolus, asthma, emphysema)     No 8. CAUSE: "What do you think is causing the breathing problem?"      Unsure 9. OTHER SYMPTOMS: "Do you have any other symptoms? (e.g., dizziness, runny nose, cough, chest pain, fever)     No 10. O2 SATURATION MONITOR:  "Do you use an oxygen saturation monitor (pulse oximeter) at home?" If Yes, "What is your reading (oxygen level) today?" "What is your usual oxygen saturation reading?" (e.g., 95%)       No 11. PREGNANCY: "Is  there any chance you are pregnant?" "When was your last menstrual period?"       No 12. TRAVEL: "Have you traveled out of the country in the last month?" (e.g., travel history, exposures)       No  Protocols used: Breathing Difficulty-A-AH

## 2021-05-23 NOTE — ED Provider Notes (Signed)
Emergency Medicine Provider Triage Evaluation Note  Arisbeth Purrington , a 40 y.o. female  was evaluated in triage.  Pt complains of ongoing abdominal pain. Patient was seen at [facility] ED 4 days ago for same complaint and was diagnosed with enteritis and noted to have increased white count. Patient presents back to [facility] ED today for same complaint. Patient states there are no new symptoms present today, she is just consistently nauseas and her prescribed zofran is not treating her symptoms.   Review of Systems  Positive: Chest pain, SOB, nausea, constipation Negative: Blood in stool, vomiting, diarrhea  Physical Exam  BP (!) 158/89 (BP Location: Left Arm)   Pulse 83   Temp 99.8 F (37.7 C) (Oral)   Resp 16   Ht 4\' 11"  (1.499 m)   Wt 44.5 kg   SpO2 100%   BMI 19.80 kg/m  Gen:   Awake, no distress   Resp:  Normal effort  MSK:   Moves extremities without difficulty  Other:  Epigastric abdominal pain on palpation   Medical Decision Making  Medically screening exam initiated at 3:05 PM.  Appropriate orders placed.  Yunuen Stofko was informed that the remainder of the evaluation will be completed by another provider, this initial triage assessment does not replace that evaluation, and the importance of remaining in the ED until their evaluation is complete.     , Al Decant 05/23/21 1518    14/02/22, MD 05/23/21 1555

## 2021-05-24 DIAGNOSIS — F419 Anxiety disorder, unspecified: Secondary | ICD-10-CM | POA: Diagnosis present

## 2021-05-24 DIAGNOSIS — N179 Acute kidney failure, unspecified: Secondary | ICD-10-CM | POA: Diagnosis present

## 2021-05-24 DIAGNOSIS — F17210 Nicotine dependence, cigarettes, uncomplicated: Secondary | ICD-10-CM | POA: Diagnosis present

## 2021-05-24 DIAGNOSIS — K529 Noninfective gastroenteritis and colitis, unspecified: Secondary | ICD-10-CM | POA: Diagnosis present

## 2021-05-24 DIAGNOSIS — Z91040 Latex allergy status: Secondary | ICD-10-CM | POA: Diagnosis not present

## 2021-05-24 DIAGNOSIS — Z560 Unemployment, unspecified: Secondary | ICD-10-CM | POA: Diagnosis not present

## 2021-05-24 DIAGNOSIS — K5900 Constipation, unspecified: Secondary | ICD-10-CM | POA: Diagnosis present

## 2021-05-24 DIAGNOSIS — Z79899 Other long term (current) drug therapy: Secondary | ICD-10-CM | POA: Diagnosis not present

## 2021-05-24 DIAGNOSIS — E876 Hypokalemia: Secondary | ICD-10-CM | POA: Diagnosis not present

## 2021-05-24 DIAGNOSIS — Z20822 Contact with and (suspected) exposure to covid-19: Secondary | ICD-10-CM | POA: Diagnosis present

## 2021-05-24 DIAGNOSIS — F1910 Other psychoactive substance abuse, uncomplicated: Secondary | ICD-10-CM

## 2021-05-24 DIAGNOSIS — F32A Depression, unspecified: Secondary | ICD-10-CM | POA: Diagnosis present

## 2021-05-24 DIAGNOSIS — Z880 Allergy status to penicillin: Secondary | ICD-10-CM | POA: Diagnosis not present

## 2021-05-24 LAB — BASIC METABOLIC PANEL
Anion gap: 12 (ref 5–15)
BUN: 32 mg/dL — ABNORMAL HIGH (ref 6–20)
CO2: 20 mmol/L — ABNORMAL LOW (ref 22–32)
Calcium: 8.8 mg/dL — ABNORMAL LOW (ref 8.9–10.3)
Chloride: 106 mmol/L (ref 98–111)
Creatinine, Ser: 1.02 mg/dL — ABNORMAL HIGH (ref 0.44–1.00)
GFR, Estimated: >60 mL/min (ref >60)
Glucose, Bld: 80 mg/dL (ref 70–99)
Potassium: 3.5 mmol/L (ref 3.5–5.1)
Sodium: 138 mmol/L (ref 135–145)

## 2021-05-24 LAB — CBC
HCT: 42.7 % (ref 36.0–46.0)
Hemoglobin: 14.5 g/dL (ref 12.0–15.0)
MCH: 34.1 pg — ABNORMAL HIGH (ref 26.0–34.0)
MCHC: 34 g/dL (ref 30.0–36.0)
MCV: 100.5 fL — ABNORMAL HIGH (ref 80.0–100.0)
Platelets: 214 10*3/uL (ref 150–400)
RBC: 4.25 MIL/uL (ref 3.87–5.11)
RDW: 14.9 % (ref 11.5–15.5)
WBC: 9.7 10*3/uL (ref 4.0–10.5)
nRBC: 0 % (ref 0.0–0.2)

## 2021-05-24 MED ORDER — POLYETHYLENE GLYCOL 3350 17 G PO PACK
17.0000 g | PACK | Freq: Two times a day (BID) | ORAL | Status: DC
  Administered 2021-05-24: 17 g via ORAL
  Filled 2021-05-24 (×2): qty 1

## 2021-05-24 MED ORDER — BISACODYL 10 MG RE SUPP
10.0000 mg | Freq: Once | RECTAL | Status: AC
  Administered 2021-05-24: 10 mg via RECTAL
  Filled 2021-05-24: qty 1

## 2021-05-24 MED ORDER — MELATONIN 5 MG PO TABS
5.0000 mg | ORAL_TABLET | Freq: Once | ORAL | Status: DC
  Filled 2021-05-24: qty 1

## 2021-05-24 MED ORDER — MELATONIN 5 MG PO TABS
5.0000 mg | ORAL_TABLET | Freq: Once | ORAL | Status: AC
  Administered 2021-05-24: 5 mg via ORAL
  Filled 2021-05-24: qty 1

## 2021-05-24 MED ORDER — POLYETHYLENE GLYCOL 3350 17 G PO PACK
17.0000 g | PACK | Freq: Every day | ORAL | Status: DC
  Administered 2021-05-24: 17 g via ORAL
  Filled 2021-05-24: qty 1

## 2021-05-24 NOTE — Plan of Care (Signed)
  Problem: Education: Goal: Knowledge of General Education information will improve Description Including pain rating scale, medication(s)/side effects and non-pharmacologic comfort measures Outcome: Progressing   Problem: Health Behavior/Discharge Planning: Goal: Ability to manage health-related needs will improve Outcome: Progressing   

## 2021-05-24 NOTE — Progress Notes (Signed)
PROGRESS NOTE    Wanda Howard  [id number] DOB: 1980/07/03 DOA: 05/23/2021 PCP: Patient, No Pcp Per (Inactive)    Brief Narrative:  40 y.o. female with medical history significant for depression who presents with concerns of abdominal pain, nausea and vomiting.   Patient has been having the symptoms for about 5 days.  She initially presented to [facility] on 11/29 and was noted to have enteritis on CT abdomen and pelvis.  She was discharged home with Zofran but has been unable to keep it down.  Continues to have daily nausea and vomiting.  Abdominal pain also worsened today. Has felt hot but no objective fever. No diarrhea. No dysuria, urgency or frequency  Assessment & Plan:   Principal Problem:   AKI (acute kidney injury) (H[facility]) Active Problems:   Gastroenteritis   Polysubstance abuse (HCC)  Abd pain likely secondary to constipation -Reports no BM in nearly one week. Last BM noted to be "small hard balls"  -Full liquid diet for bowel rest.  Advance as tolerated. -PRN Zofran IV for nausea/vomiting -PRN IV morphine for pain  -Will give trial of dulcolax suppository. Continue cathartics as needed -Still slow to tolerate PO, citing nausea and abd fullness   AKI -Creatinine of 2.58 from prior of 0.9. -IV fluid resuscitation with LR overnight - Avoid nephrotoxic agent or contrasted study.   -Cr improved to 1 this AM   Polysubstance use  -patient endorsed tobacco, alcohol and cocaine -Will need cessation   DVT prophylaxis: Lovenox subq Code Status: Full Family Communication: Pt in room, family not at bedside  Status is: Observation  The patient remains OBS appropriate and will d/c before 2 midnights.   Consultants:    Procedures:    Antimicrobials: Anti-infectives (From admission, onward)    None       Subjective: Complaining of abd discomfort, nausea  Objective: Vitals:   05/24/21 1000 05/24/21 1100 05/24/21 1225 05/24/21 1414  BP: (!) 174/90 (!)  160/90 (!) 163/97 (!) 157/98  Pulse: (!) 55 (!) 53 (!) 55 62  Resp: (!) 24 20 20 17   Temp:   98.3 F (36.8 C) 98.8 F (37.1 C)  TempSrc:   Oral Oral  SpO2: 100% 98% 100% 100%  Weight:      Height:        Intake/Output Summary (Last 24 hours) at 05/24/2021 1512 Last data filed at 05/24/2021 0701 Gross per 24 hour  Intake 3812.09 ml  Output --  Net 3812.09 ml   Filed Weights   05/23/21 1443  Weight: 44.5 kg    Examination: General exam: Awake, laying in bed, in nad Respiratory system: Normal respiratory effort, no wheezing Cardiovascular system: regular rate, s1, s2 Gastrointestinal system: mildly distended, decreased BS Central nervous system: CN2-12 grossly intact, strength intact Extremities: Perfused, no clubbing Skin: Normal skin turgor, no notable skin lesions seen Psychiatry: Mood normal // no visual hallucinations   Data Reviewed: I have personally reviewed following labs and imaging studies  CBC: Recent Labs  Lab 05/19/21 1954 05/19/21 2119 05/23/21 1503 05/24/21 0500  WBC 27.5*  --  11.8* 9.7  NEUTROABS 26.4*  --   --   --   HGB 17.3* 17.7* 17.0* 14.5  HCT 50.0* 52.0* 48.6* 42.7  MCV 101.0*  --  98.0 100.5*  PLT 312  --  286 214   Basic Metabolic Panel: Recent Labs  Lab 05/19/21 1954 05/19/21 2119 05/23/21 1503 05/24/21 0500  NA 144 143 135 138  K 3.6 3.4* 3.6  3.5  CL 106 108 96* 106  CO2 21*  --  25 20*  GLUCOSE 132* 125* 121* 80  BUN 14 16 59* 32*  CREATININE 1.23* 0.90 2.58* 1.02*  CALCIUM 9.6  --  9.3 8.8*   GFR: Estimated Creatinine Clearance: 50.5 mL/min (A) (by C-G formula based on SCr of 1.02 mg/dL (H)). Liver Function Tests: Recent Labs  Lab 05/19/21 1954 05/23/21 1503  AST 23 13*  ALT 12 10  ALKPHOS 79 64  BILITOT 1.9* 0.8  PROT 7.9 8.3*  ALBUMIN 3.9 3.4*   Recent Labs  Lab 05/19/21 1954 05/23/21 1503  LIPASE 22 22   No results for input(s): AMMONIA in the last 168 hours. Coagulation Profile: No results for  input(s): INR, PROTIME in the last 168 hours. Cardiac Enzymes: No results for input(s): CKTOTAL, CKMB, CKMBINDEX, TROPONINI in the last 168 hours. BNP (last 3 results) No results for input(s): PROBNP in the last 8760 hours. HbA1C: No results for input(s): HGBA1C in the last 72 hours. CBG: No results for input(s): GLUCAP in the last 168 hours. Lipid Profile: No results for input(s): CHOL, HDL, LDLCALC, TRIG, CHOLHDL, LDLDIRECT in the last 72 hours. Thyroid Function Tests: No results for input(s): TSH, T4TOTAL, FREET4, T3FREE, THYROIDAB in the last 72 hours. Anemia Panel: No results for input(s): VITAMINB12, FOLATE, FERRITIN, TIBC, IRON, RETICCTPCT in the last 72 hours. Sepsis Labs: No results for input(s): PROCALCITON, LATICACIDVEN in the last 168 hours.  Recent Results (from the past 240 hour(s))  Resp Panel by RT-PCR (Flu A&B, Covid) Nasopharyngeal Swab     Status: None   Collection Time: 05/23/21  9:08 PM   Specimen: Nasopharyngeal Swab; Nasopharyngeal(NP) swabs in vial transport medium  Result Value Ref Range Status   SARS Coronavirus 2 by RT PCR NEGATIVE NEGATIVE Final    Comment: (NOTE) SARS-CoV-2 target nucleic acids are NOT DETECTED.  The SARS-CoV-2 RNA is generally detectable in upper respiratory specimens during the acute phase of infection. The lowest concentration of SARS-CoV-2 viral copies this assay can detect is 138 copies/mL. A negative result does not preclude SARS-Cov-2 infection and should not be used as the sole basis for treatment or other patient management decisions. A negative result may occur with  improper specimen collection/handling, submission of specimen other than nasopharyngeal swab, presence of viral mutation(s) within the areas targeted by this assay, and inadequate number of viral copies(<138 copies/mL). A negative result must be combined with clinical observations, patient history, and epidemiological information. The expected result is  Negative.  Fact Sheet for Patients:  BloggerCourse.com  Fact Sheet for Healthcare Providers:  SeriousBroker.it  This test is no t yet approved or cleared by the [place] FDA and  has been authorized for detection and/or diagnosis of SARS-CoV-2 by FDA under an Emergency Use Authorization (EUA). This EUA will remain  in effect (meaning this test can be used) for the duration of the COVID-19 declaration under Section 564(b)(1) of the Act, 21 U.S.C.section 360bbb-3(b)(1), unless the authorization is terminated  or revoked sooner.       Influenza A by PCR NEGATIVE NEGATIVE Final   Influenza B by PCR NEGATIVE NEGATIVE Final    Comment: (NOTE) The Xpert Xpress SARS-CoV-2/FLU/RSV plus assay is intended as an aid in the diagnosis of influenza from Nasopharyngeal swab specimens and should not be used as a sole basis for treatment. Nasal washings and aspirates are unacceptable for Xpert Xpress SARS-CoV-2/FLU/RSV testing.  Fact Sheet for Patients: BloggerCourse.com  Fact Sheet for Healthcare Providers:  SeriousBroker.it  This test is not yet approved or cleared by the [place] and has been authorized for detection and/or diagnosis of SARS-CoV-2 by FDA under an Emergency Use Authorization (EUA). This EUA will remain in effect (meaning this test can be used) for the duration of the COVID-19 declaration under Section 564(b)(1) of the Act, 21 U.S.C. section 360bbb-3(b)(1), unless the authorization is terminated or revoked.  Performed at [facility], [address] [place], [place] [address]      Radiology Studies: No results found.  Scheduled Meds:  enoxaparin (LOVENOX) injection  30 mg Subcutaneous Q24H   Continuous Infusions:  lactated ringers 100 mL/hr at 05/24/21 0701     LOS: 0 days   Rickey Barbara, MD [facility] Pager On  Amion  If 7PM-7AM, please contact night-coverage 05/24/2021, 3:12 PM

## 2021-05-25 LAB — COMPREHENSIVE METABOLIC PANEL
ALT: 30 U/L (ref 0–44)
AST: 42 U/L — ABNORMAL HIGH (ref 15–41)
Albumin: 2.4 g/dL — ABNORMAL LOW (ref 3.5–5.0)
Alkaline Phosphatase: 55 U/L (ref 38–126)
Anion gap: 8 (ref 5–15)
BUN: 16 mg/dL (ref 6–20)
CO2: 25 mmol/L (ref 22–32)
Calcium: 8.8 mg/dL — ABNORMAL LOW (ref 8.9–10.3)
Chloride: 103 mmol/L (ref 98–111)
Creatinine, Ser: 0.55 mg/dL (ref 0.44–1.00)
GFR, Estimated: >60 mL/min (ref >60)
Glucose, Bld: 98 mg/dL (ref 70–99)
Potassium: 2.8 mmol/L — ABNORMAL LOW (ref 3.5–5.1)
Sodium: 136 mmol/L (ref 135–145)
Total Bilirubin: 0.8 mg/dL (ref 0.3–1.2)
Total Protein: 5.9 g/dL — ABNORMAL LOW (ref 6.5–8.1)

## 2021-05-25 LAB — HIV ANTIBODY (ROUTINE TESTING W REFLEX): HIV Screen 4th Generation wRfx: NONREACTIVE

## 2021-05-25 LAB — URINE CULTURE

## 2021-05-25 MED ORDER — POLYETHYLENE GLYCOL 3350 17 G PO PACK: 17.0000 g | PACK | Freq: Every day | ORAL | 0 refills | Status: AC | PRN

## 2021-05-25 MED ORDER — POTASSIUM CHLORIDE CRYS ER 20 MEQ PO TBCR
60.0000 meq | EXTENDED_RELEASE_TABLET | Freq: Four times a day (QID) | ORAL | Status: AC
  Administered 2021-05-25 (×2): 60 meq via ORAL
  Filled 2021-05-25 (×2): qty 3

## 2021-05-25 NOTE — Plan of Care (Signed)
  Problem: Education: Goal: Knowledge of General Education information will improve Description Including pain rating scale, medication(s)/side effects and non-pharmacologic comfort measures Outcome: Progressing   

## 2021-05-25 NOTE — Progress Notes (Signed)
The patient is alert and oriented and has been seen by her physician. The orders for discharge were written. IV has been removed. Went over discharge instructions with patient. She is being walked down to her ride with all of her belongings.

## 2021-05-25 NOTE — Discharge Summary (Signed)
Physician Discharge Summary  Wanda Howard WUJ:[id number] DOB: 1980-11-21 DOA: 05/23/2021  PCP: Patient, No Pcp Per (Inactive)  Admit date: 05/23/2021 Discharge date: 05/25/2021  Admitted From: Home Disposition:  Home  Recommendations for Outpatient Follow-up:  Follow up with PCP in 1-2 weeks  Discharge Condition:Improved CODE STATUS:Full Diet recommendation: Regular   Brief/Interim Summary: 40 y.o. female with medical history significant for depression who presents with concerns of abdominal pain, nausea and vomiting.   Patient has been having the symptoms for about 5 days.  She initially presented to [facility] on 11/29 and was noted to have enteritis on CT abdomen and pelvis.  She was discharged home with Zofran but has been unable to keep it down.  Continues to have daily nausea and vomiting.  Abdominal pain also worsened today. Has felt hot but no objective fever. No diarrhea. No dysuria, urgency or frequency    Discharge Diagnoses:  Principal Problem:   AKI (acute kidney injury) (HCC) Active Problems:   Gastroenteritis   Polysubstance abuse (HCC)   ARF (acute renal failure) (HCC)  Abd pain likely secondary to constipation -Reports no BM in nearly one week. Last BM noted to be "small hard balls"  -Full liquid diet for bowel rest.  Advance as tolerated. -PRN Zofran IV for nausea/vomiting -PRN IV morphine for pain  -Improvement following multiple bowel movements after receiving cathartics -Will prescribe PRN miralax on d/c   AKI -Creatinine of 2.58 from prior of 0.9. -Renal function normalized after IVF hydration   Polysubstance use  -patient endorsed tobacco, alcohol and cocaine -Will need cessation  Hypokalemia -Replaced prior to d/c    Discharge Instructions   Allergies as of 05/25/2021       Reactions   Latex Hives   Penicillins Hives   Did it involve swelling of the face/tongue/throat, SOB, or low BP? No Did it involve sudden or severe rash/hives, skin  peeling, or any reaction on the inside of your mouth or nose? No Did you need to seek medical attention at a hospital or doctor's office? No When did it last happen? Doesn't recall       If all above answers are "NO", may proceed with cephalosporin use.        Medication List     STOP taking these medications    gabapentin 300 MG capsule Commonly known as: NEURONTIN       TAKE these medications    medroxyPROGESTERone 150 MG/ML injection Commonly known as: DEPO-PROVERA Inject 150 mg into the muscle every 3 (three) months.   ondansetron 4 MG disintegrating tablet Commonly known as: ZOFRAN-ODT Take 1 tablet (4 mg total) by mouth every 8 (eight) hours as needed for nausea or vomiting.   polyethylene glycol 17 g packet Commonly known as: MIRALAX / GLYCOLAX Take 17 g by mouth daily as needed for moderate constipation.   QUEtiapine 50 MG tablet Commonly known as: SEROQUEL Take 1 tablet (50 mg total) by mouth at bedtime.        Follow-up Information     Follow up with your PCP in 1-2 weeks Follow up.   Why: Hospital follow up               Allergies  Allergen Reactions   Latex Hives   Penicillins Hives    Did it involve swelling of the face/tongue/throat, SOB, or low BP? No Did it involve sudden or severe rash/hives, skin peeling, or any reaction on the inside of your mouth or nose? No Did  you need to seek medical attention at a hospital or doctor's office? No When did it last happen? Doesn't recall       If all above answers are "NO", may proceed with cephalosporin use.     Procedures/Studies: CT Abdomen Pelvis W Contrast  Result Date: 05/20/2021 CLINICAL DATA:  Nausea vomiting.  Epigastric pain. EXAM: CT ABDOMEN AND PELVIS WITH CONTRAST TECHNIQUE: Multidetector CT imaging of the abdomen and pelvis was performed using the standard protocol following bolus administration of intravenous contrast. CONTRAST:  46mL OMNIPAQUE IOHEXOL 350 MG/ML SOLN COMPARISON:   None. FINDINGS: Lower chest: The visualized lung bases are clear. No intra-abdominal free air.  Small free fluid within the pelvis. Hepatobiliary: No focal liver abnormality is seen. No gallstones, gallbladder wall thickening, or biliary dilatation. Pancreas: Unremarkable. No pancreatic ductal dilatation or surrounding inflammatory changes. Spleen: Normal in size without focal abnormality. Adrenals/Urinary Tract: The adrenal glands unremarkable. The kidneys, visualized ureters, and urinary bladder appear unremarkable. Stomach/Bowel: There is diffuse thickened appearance of the small bowel loops most consistent with enteritis. Clinical correlation is recommended. No bowel obstruction. The appendix is normal as visualized. Vascular/Lymphatic: The abdominal aorta and IVC are unremarkable. No portal venous gas. There is no adenopathy. Reproductive: The uterus is anteverted and grossly unremarkable. No adnexal masses. Other: None Musculoskeletal: No acute or significant osseous findings. IMPRESSION: Findings most consistent with enteritis. No bowel obstruction. Normal appendix. Electronically Signed   By: Elgie Collard M.D.   On: 05/20/2021 02:01    Subjective: Very eager to go home  Discharge Exam: Vitals:   05/24/21 2113 05/25/21 0508  BP: 132/76 128/73  Pulse: (!) 58 77  Resp: 20 16  Temp: 98.7 F (37.1 C) 98.7 F (37.1 C)  SpO2: 97% 98%   Vitals:   05/24/21 1414 05/24/21 1722 05/24/21 2113 05/25/21 0508  BP: (!) 157/98 (!) 157/88 132/76 128/73  Pulse: 62 61 (!) 58 77  Resp: 17  20 16   Temp: 98.8 F (37.1 C) 99.1 F (37.3 C) 98.7 F (37.1 C) 98.7 F (37.1 C)  TempSrc: Oral Oral Oral Oral  SpO2: 100% 97% 97% 98%  Weight:      Height:        General: Pt is alert, awake, not in acute distress Cardiovascular: RRR, S1/S2 + Respiratory: CTA bilaterally, no wheezing, no rhonchi Abdominal: Soft, NT, ND, bowel sounds + Extremities: no edema, no cyanosis   The results of significant  diagnostics from this hospitalization (including imaging, microbiology, ancillary and laboratory) are listed below for reference.     Microbiology: Recent Results (from the past 240 hour(s))  Urine Culture     Status: Abnormal   Collection Time: 05/23/21  4:43 PM   Specimen: Urine, Clean Catch  Result Value Ref Range Status   Specimen Description   Final    URINE, CLEAN CATCH Performed at [facility], [address], [place], [place]    Special Requests   Final    NONE Performed at [facility], [address] [place], [place]    Culture MULTIPLE SPECIES PRESENT, SUGGEST RECOLLECTION (A)  Final   Report Status 05/25/2021 FINAL  Final  Resp Panel by RT-PCR (Flu A&B, Covid) Nasopharyngeal Swab     Status: None   Collection Time: 05/23/21  9:08 PM   Specimen: Nasopharyngeal Swab; Nasopharyngeal(NP) swabs in vial transport medium  Result Value Ref Range Status   SARS Coronavirus 2 by RT PCR NEGATIVE NEGATIVE Final  Comment: (NOTE) SARS-CoV-2 target nucleic acids are NOT DETECTED.  The SARS-CoV-2 RNA is generally detectable in upper respiratory specimens during the acute phase of infection. The lowest concentration of SARS-CoV-2 viral copies this assay can detect is 138 copies/mL. A negative result does not preclude SARS-Cov-2 infection and should not be used as the sole basis for treatment or other patient management decisions. A negative result may occur with  improper specimen collection/handling, submission of specimen other than nasopharyngeal swab, presence of viral mutation(s) within the areas targeted by this assay, and inadequate number of viral copies(<138 copies/mL). A negative result must be combined with clinical observations, patient history, and epidemiological information. The expected result is Negative.  Fact Sheet for Patients:  BloggerCourse.com  Fact Sheet for Healthcare  Providers:  SeriousBroker.it  This test is no t yet approved or cleared by the [place] FDA and  has been authorized for detection and/or diagnosis of SARS-CoV-2 by FDA under an Emergency Use Authorization (EUA). This EUA will remain  in effect (meaning this test can be used) for the duration of the COVID-19 declaration under Section 564(b)(1) of the Act, 21 U.S.C.section 360bbb-3(b)(1), unless the authorization is terminated  or revoked sooner.       Influenza A by PCR NEGATIVE NEGATIVE Final   Influenza B by PCR NEGATIVE NEGATIVE Final    Comment: (NOTE) The Xpert Xpress SARS-CoV-2/FLU/RSV plus assay is intended as an aid in the diagnosis of influenza from Nasopharyngeal swab specimens and should not be used as a sole basis for treatment. Nasal washings and aspirates are unacceptable for Xpert Xpress SARS-CoV-2/FLU/RSV testing.  Fact Sheet for Patients: BloggerCourse.com  Fact Sheet for Healthcare Providers: SeriousBroker.it  This test is not yet approved or cleared by the [place] FDA and has been authorized for detection and/or diagnosis of SARS-CoV-2 by FDA under an Emergency Use Authorization (EUA). This EUA will remain in effect (meaning this test can be used) for the duration of the COVID-19 declaration under Section 564(b)(1) of the Act, 21 U.S.C. section 360bbb-3(b)(1), unless the authorization is terminated or revoked.  Performed at [facility], [address] [place], [place] [address]      Labs: BNP (last 3 results) No results for input(s): BNP in the last 8760 hours. Basic Metabolic Panel: Recent Labs  Lab 05/19/21 1954 05/19/21 2119 05/23/21 1503 05/24/21 0500 05/25/21 0331  NA 144 143 135 138 136  K 3.6 3.4* 3.6 3.5 2.8*  CL 106 108 96* 106 103  CO2 21*  --  25 20* 25  GLUCOSE 132* 125* 121* 80 98  BUN 14 16 59* 32* 16  CREATININE 1.23*  0.90 2.58* 1.02* 0.55  CALCIUM 9.6  --  9.3 8.8* 8.8*   Liver Function Tests: Recent Labs  Lab 05/19/21 1954 05/23/21 1503 05/25/21 0331  AST 23 13* 42*  ALT ALKPHOS 79 64 55  BILITOT 1.9* 0.8 0.8  PROT 7.9 8.3* 5.9*  ALBUMIN 3.9 3.4* 2.4*   Recent Labs  Lab 05/19/21 1954 05/23/21 1503  LIPASE 22 22   No results for input(s): AMMONIA in the last 168 hours. CBC: Recent Labs  Lab 05/19/21 1954 05/19/21 2119 05/23/21 1503 05/24/21 0500  WBC 27.5*  --  11.8* 9.7  NEUTROABS 26.4*  --   --   --   HGB 17.3* 17.7* 17.0* 14.5  HCT 50.0* 52.0* 48.6* 42.7  MCV 101.0*  --  98.0 100.5*  PLT 312  --  286 214  Cardiac Enzymes: No results for input(s): CKTOTAL, CKMB, CKMBINDEX, TROPONINI in the last 168 hours. BNP: Invalid input(s): POCBNP CBG: No results for input(s): GLUCAP in the last 168 hours. D-Dimer No results for input(s): DDIMER in the last 72 hours. Hgb A1c No results for input(s): HGBA1C in the last 72 hours. Lipid Profile No results for input(s): CHOL, HDL, LDLCALC, TRIG, CHOLHDL, LDLDIRECT in the last 72 hours. Thyroid function studies No results for input(s): TSH, T4TOTAL, T3FREE, THYROIDAB in the last 72 hours.  Invalid input(s): FREET3 Anemia work up No results for input(s): VITAMINB12, FOLATE, FERRITIN, TIBC, IRON, RETICCTPCT in the last 72 hours. Urinalysis    Component Value Date/Time   COLORURINE YELLOW 05/23/2021 1643   APPEARANCEUR HAZY (A) 05/23/2021 1643   LABSPEC 1.015 05/23/2021 1643   PHURINE 6.0 05/23/2021 1643   GLUCOSEU 50 (A) 05/23/2021 1643   HGBUR MODERATE (A) 05/23/2021 1643   BILIRUBINUR NEGATIVE 05/23/2021 1643   KETONESUR 5 (A) 05/23/2021 1643   PROTEINUR 100 (A) 05/23/2021 1643   NITRITE NEGATIVE 05/23/2021 1643   LEUKOCYTESUR NEGATIVE 05/23/2021 1643   Sepsis Labs Invalid input(s): PROCALCITONIN,  WBC,  LACTICIDVEN Microbiology Recent Results (from the past 240 hour(s))  Urine Culture     Status: Abnormal    Collection Time: 05/23/21  4:43 PM   Specimen: Urine, Clean Catch  Result Value Ref Range Status   Specimen Description   Final    URINE, CLEAN CATCH Performed at [facility], [address] [place], [place] [address]    Special Requests   Final    NONE Performed at [facility], [address], [place], [place] [address]    Culture MULTIPLE SPECIES PRESENT, SUGGEST RECOLLECTION (A)  Final   Report Status 05/25/2021 FINAL  Final  Resp Panel by RT-PCR (Flu A&B, Covid) Nasopharyngeal Swab     Status: None   Collection Time: 05/23/21  9:08 PM   Specimen: Nasopharyngeal Swab; Nasopharyngeal(NP) swabs in vial transport medium  Result Value Ref Range Status   SARS Coronavirus 2 by RT PCR NEGATIVE NEGATIVE Final    Comment: (NOTE) SARS-CoV-2 target nucleic acids are NOT DETECTED.  The SARS-CoV-2 RNA is generally detectable in upper respiratory specimens during the acute phase of infection. The lowest concentration of SARS-CoV-2 viral copies this assay can detect is 138 copies/mL. A negative result does not preclude SARS-Cov-2 infection and should not be used as the sole basis for treatment or other patient management decisions. A negative result may occur with  improper specimen collection/handling, submission of specimen other than nasopharyngeal swab, presence of viral mutation(s) within the areas targeted by this assay, and inadequate number of viral copies(<138 copies/mL). A negative result must be combined with clinical observations, patient history, and epidemiological information. The expected result is Negative.  Fact Sheet for Patients:  BloggerCourse.com  Fact Sheet for Healthcare Providers:  SeriousBroker.it  This test is no t yet approved or cleared by the [place] FDA and  has been authorized for detection and/or diagnosis of SARS-CoV-2 by FDA under an Emergency Use  Authorization (EUA). This EUA will remain  in effect (meaning this test can be used) for the duration of the COVID-19 declaration under Section 564(b)(1) of the Act, 21 U.S.C.section 360bbb-3(b)(1), unless the authorization is terminated  or revoked sooner.       Influenza A by PCR NEGATIVE NEGATIVE Final   Influenza B by PCR NEGATIVE NEGATIVE Final    Comment: (NOTE) The Xpert Xpress SARS-CoV-2/FLU/RSV plus assay  is intended as an aid in the diagnosis of influenza from Nasopharyngeal swab specimens and should not be used as a sole basis for treatment. Nasal washings and aspirates are unacceptable for Xpert Xpress SARS-CoV-2/FLU/RSV testing.  Fact Sheet for Patients: BloggerCourse.com  Fact Sheet for Healthcare Providers: SeriousBroker.it  This test is not yet approved or cleared by the [place] FDA and has been authorized for detection and/or diagnosis of SARS-CoV-2 by FDA under an Emergency Use Authorization (EUA). This EUA will remain in effect (meaning this test can be used) for the duration of the COVID-19 declaration under Section 564(b)(1) of the Act, 21 U.S.C. section 360bbb-3(b)(1), unless the authorization is terminated or revoked.  Performed at [facility], [address], [place], [place] [address]    Time spent: 30 min  SIGNED:   Rickey Barbara, MD  [facility] 05/25/2021, 6:48 PM  If 7PM-7AM, please contact night-coverage

## 2022-10-09 ENCOUNTER — Ambulatory Visit
Admission: EM | Admit: 2022-10-09 | Discharge: 2022-10-09 | Disposition: A | Discharge status: Home / Self Care | Payer: No Typology Code available for payment source | Attending: Emergency Medicine | Admitting: Emergency Medicine

## 2022-10-09 ENCOUNTER — Other Ambulatory Visit: Payer: Self-pay

## 2022-10-09 DIAGNOSIS — L219 Seborrheic dermatitis, unspecified: Secondary | ICD-10-CM | POA: Diagnosis not present

## 2022-10-09 DIAGNOSIS — L259 Unspecified contact dermatitis, unspecified cause: Secondary | ICD-10-CM

## 2022-10-09 MED ORDER — NYSTATIN-TRIAMCINOLONE 100000-0.1 UNIT/GM-% EX CREA: TOPICAL_CREAM | CUTANEOUS | 0 refills | Status: AC

## 2022-10-09 MED ORDER — METRONIDAZOLE 500 MG PO TABS: 500.0000 mg | ORAL_TABLET | Freq: Two times a day (BID) | ORAL | 0 refills | Status: DC

## 2022-10-09 MED ORDER — TRIAMCINOLONE ACETONIDE 0.1 % EX CREA: 1.0000 | TOPICAL_CREAM | Freq: Two times a day (BID) | CUTANEOUS | 0 refills | Status: AC

## 2022-10-09 NOTE — ED Provider Notes (Signed)
[facility]    CSN: [id number] Arrival date & time: 10/09/22  1033      History   Chief Complaint Chief Complaint  Patient presents with   Rash    HPI Wanda Howard is a 42 y.o. female.   Patient presents today with a rash for 6 approximately 5 months after living in her vehicle.  Patient states the areas are circular in nature very itchy.  Patient states they do not blister or drain.  Patient has the similar areas over the body on arms scalp bilateral legs abdomen.  She has been applying a over-the-counter cream that helps very minimally.    Past Medical History:  Diagnosis Date   Anxiety    Carpal tunnel syndrome    Depression     Patient Active Problem List   Diagnosis Date Noted   Gastroenteritis 05/24/2021   Polysubstance abuse 05/24/2021   ARF (acute renal failure) 05/24/2021   AKI (acute kidney injury) 05/23/2021   MDD (major depressive disorder), recurrent episode, severe 12/22/2018    Past Surgical History:  Procedure Laterality Date   CESAREAN SECTION      OB History   No obstetric history on file.      Home Medications    Prior to Admission medications   Medication Sig Start Date End Date Taking? Authorizing Provider  metroNIDAZOLE (FLAGYL) 500 MG tablet Take 1 tablet (500 mg total) by mouth 2 (two) times daily. 10/09/22  Yes Coralyn Mark, NP  nystatin-triamcinolone [facility]) cream Apply to affected area daily 10/09/22  Yes Maple Mirza L, NP  triamcinolone cream (KENALOG) 0.1 % Apply 1 Application topically 2 (two) times daily. 10/09/22  Yes Coralyn Mark, NP  medroxyPROGESTERone (DEPO-PROVERA) 150 MG/ML injection Inject 150 mg into the muscle every 3 (three) months. Patient not taking: Reported on 05/24/2021    [provider]  ondansetron (ZOFRAN-ODT) 4 MG disintegrating tablet Take 1 tablet (4 mg total) by mouth every 8 (eight) hours as needed for nausea or vomiting. Patient not taking: Reported on  05/24/2021 05/20/21   Sabas Sous, MD  polyethylene glycol (MIRALAX / GLYCOLAX) 17 g packet Take 17 g by mouth daily as needed for moderate constipation. 05/25/21   Jerald Kief, MD  QUEtiapine (SEROQUEL) 50 MG tablet Take 1 tablet (50 mg total) by mouth at bedtime. Patient not taking: Reported on 05/24/2021 12/26/18   Malvin Johns, MD    Family History History reviewed. No pertinent family history.  Social History Social History   Tobacco Use   Smoking status: Every Day    Packs/day: 1    Types: Cigarettes   Smokeless tobacco: Never  Vaping Use   Vaping Use: Never used  Substance Use Topics   Alcohol use: Yes   Drug use: Yes    Frequency: 7.0 times per week    Types: Amphetamines, Cocaine, Marijuana    Comment: +Amph; THC; Coc     Allergies   Latex and Penicillins   Review of Systems Review of Systems  Constitutional: Negative.   Respiratory: Negative.    Cardiovascular: Negative.   Genitourinary: Negative.   Skin:  Positive for rash.  Neurological: Negative.      Physical Exam Triage Vital Signs ED Triage Vitals [10/09/22 1045]  Enc Vitals Group     BP (!) 140/93     Pulse Rate 65     Resp 18     Temp 97.8 F (36.6 C)     Temp Source  Oral     SpO2 98 %     Weight      Height      Head Circumference      Peak Flow      Pain Score      Pain Loc      Pain Edu?      Excl. in GC?    No data found.  Updated Vital Signs BP (!) 140/93 (BP Location: Left Arm)   Pulse 65   Temp 97.8 F (36.6 C) (Oral)   Resp 18   SpO2 98%   Visual Acuity Right Eye Distance:   Left Eye Distance:   Bilateral Distance:    Right Eye Near:   Left Eye Near:    Bilateral Near:     Physical Exam Constitutional:      Appearance: Normal appearance.  Abdominal:     General: Abdomen is flat.  Musculoskeletal:        General: Normal range of motion.  Skin:    General: Skin is warm.     Findings: Rash present.     Comments: Several areas of circular dry patchy  areas to abdomen lower legs bilateral arms and scalp.  Areas dry in nature with no blisters no draining.  Neurological:     Mental Status: She is alert.      UC Treatments / Results  Labs (all labs ordered are listed, but only abnormal results are displayed) Labs Reviewed - No data to display  EKG   Radiology No results found.  Procedures Procedures (including critical care time)  Medications Ordered in UC Medications - No data to display  Initial Impression / Assessment and Plan / UC Course  I have reviewed the triage vital signs and the nursing notes.  Pertinent labs & imaging results that were available during my care of the patient were reviewed by me and considered in my medical decision making (see chart for details).    Apply creams twice a day after washing and drying the area well Rashes can take several weeks for treatment if symptoms persist you will need to follow-up with dermatology Avoid drinking any alcohol while taking the medication this can make you have n/v  Apply head and shoulders to hair twice a week   Final Clinical Impressions(s) / UC Diagnoses   Final diagnoses:  Contact dermatitis and eczema  Seborrhea     Discharge Instructions      Apply creams twice a day after washing and drying the area well Rashes can take several weeks for treatment if symptoms persist you will need to follow-up with dermatology Avoid drinking any alcohol while taking the medication this can make you very ill    ED Prescriptions     Medication Sig Dispense Auth. Provider   metroNIDAZOLE (FLAGYL) 500 MG tablet Take 1 tablet (500 mg total) by mouth 2 (two) times daily. 14 tablet Coralyn Mark, NP   nystatin-triamcinolone (MYCOLOG II) cream Apply to affected area daily 15 g Maple Mirza L, NP   triamcinolone cream (KENALOG) 0.1 % Apply 1 Application topically 2 (two) times daily. 30 g Coralyn Mark, NP      PDMP not reviewed this encounter.    Coralyn Mark, NP 10/09/22 1105

## 2022-10-09 NOTE — ED Triage Notes (Signed)
Rash all over body x 5 months. Pt stated she use to sleep in her car.

## 2022-10-09 NOTE — Discharge Instructions (Addendum)
Apply creams twice a day after washing and drying the area well Rashes can take several weeks for treatment if symptoms persist you will need to follow-up with dermatology Avoid drinking any alcohol while taking the medication this can make you very ill

## 2022-10-26 ENCOUNTER — Telehealth: Payer: Self-pay | Admitting: Family Medicine

## 2022-10-26 ENCOUNTER — Encounter: Payer: Self-pay | Admitting: Family Medicine

## 2022-10-26 ENCOUNTER — Ambulatory Visit (INDEPENDENT_AMBULATORY_CARE_PROVIDER_SITE_OTHER): Payer: Medicaid Other | Admitting: Family Medicine

## 2022-10-26 VITALS — BP 116/76 | HR 84 | Temp 98.1°F | Resp 16 | Ht 59.0 in | Wt 93.2 lb

## 2022-10-26 DIAGNOSIS — Z7689 Persons encountering health services in other specified circumstances: Secondary | ICD-10-CM

## 2022-10-26 DIAGNOSIS — L309 Dermatitis, unspecified: Secondary | ICD-10-CM | POA: Diagnosis not present

## 2022-10-26 MED ORDER — HYDROXYZINE HCL 25 MG PO TABS: 25.0000 mg | ORAL_TABLET | Freq: Four times a day (QID) | ORAL | 1 refills | Status: AC | PRN

## 2022-10-26 NOTE — Telephone Encounter (Signed)
Pt is calling to report that the pharmacy does not see scripts called in for cremes. And pt is requesting the generic. Please advise [phone]

## 2022-10-26 NOTE — Progress Notes (Unsigned)
Patient is here to established care with provider today. Patient has many health concern they would like to discuss with provider today  Care gaps discuss at appointment today  

## 2022-10-27 NOTE — Telephone Encounter (Signed)
Patient request cream for rash

## 2022-10-28 ENCOUNTER — Encounter: Payer: Self-pay | Admitting: Family Medicine

## 2022-10-28 NOTE — Progress Notes (Signed)
New Patient Office Visit  Subjective    Patient ID: Wanda Howard, female    DOB: 08/19/1980  Age: 42 y.o. MRN: [id number]  CC:  Chief Complaint  Patient presents with   Establish Care    HPI Wanda Howard presents to establish care and for complaint of lesions that have occurred intermittently over her body and she has been itching with minimal relief for several weeks. Patient denies known contacts or exposures.    Outpatient Encounter Medications as of 10/26/2022  Medication Sig   hydrOXYzine (ATARAX) 25 MG tablet Take 1 tablet (25 mg total) by mouth every 6 (six) hours as needed for itching.   medroxyPROGESTERone (DEPO-PROVERA) 150 MG/ML injection Inject 150 mg into the muscle every 3 (three) months. (Patient not taking: Reported on 05/24/2021)   metroNIDAZOLE (FLAGYL) 500 MG tablet Take 1 tablet (500 mg total) by mouth 2 (two) times daily. (Patient not taking: Reported on 10/26/2022)   nystatin-triamcinolone (MYCOLOG II) cream Apply to affected area daily (Patient not taking: Reported on 10/26/2022)   ondansetron (ZOFRAN-ODT) 4 MG disintegrating tablet Take 1 tablet (4 mg total) by mouth every 8 (eight) hours as needed for nausea or vomiting. (Patient not taking: Reported on 05/24/2021)   polyethylene glycol (MIRALAX / GLYCOLAX) 17 g packet Take 17 g by mouth daily as needed for moderate constipation. (Patient not taking: Reported on 10/26/2022)   QUEtiapine (SEROQUEL) 50 MG tablet Take 1 tablet (50 mg total) by mouth at bedtime. (Patient not taking: Reported on 05/24/2021)   triamcinolone cream (KENALOG) 0.1 % Apply 1 Application topically 2 (two) times daily. (Patient not taking: Reported on 10/26/2022)   No facility-administered encounter medications on file as of 10/26/2022.    Past Medical History:  Diagnosis Date   Anxiety    Carpal tunnel syndrome    Depression     Past Surgical History:  Procedure Laterality Date   CESAREAN SECTION      No family history on  file.  Social History   Socioeconomic History   Marital status: Single    Spouse name: Not on file   Number of children: Not on file   Years of education: Not on file   Highest education level: Not on file  Occupational History   Occupation: Unemployed  Tobacco Use   Smoking status: Every Day    Packs/day: 1    Types: Cigarettes   Smokeless tobacco: Never  Vaping Use   Vaping Use: Never used  Substance and Sexual Activity   Alcohol use: Yes   Drug use: Yes    Frequency: 7.0 times per week    Types: Amphetamines, Cocaine, Marijuana    Comment: +Amph; THC; Coc   Sexual activity: Not Currently    Birth control/protection: Injection  Other Topics Concern   Not on file  Social History Narrative   Unemployed, no fixed address   Social Determinants of Health   Financial Resource Strain: Low Risk  (10/26/2022)   Overall Financial Resource Strain (CARDIA)    Difficulty of Paying Living Expenses: Not very hard  Food Insecurity: No Food Insecurity (10/26/2022)   Hunger Vital Sign    Worried About Running Out of Food in the Last Year: Never true    Ran Out of Food in the Last Year: Never true  Transportation Needs: No Transportation Needs (10/26/2022)   PRAPARE - Administrator, Civil Service (Medical): No    Lack of Transportation (Non-Medical): No  Physical Activity: Sufficiently Active (10/26/2022)  Exercise Vital Sign    Days of Exercise per Week: 7 days    Minutes of Exercise per Session: 30 min  Stress: No Stress Concern Present (10/26/2022)   [employer] of Occupational Health - Occupational Stress Questionnaire    Feeling of Stress : Not at all  Social Connections: Moderately Integrated (10/26/2022)   Social Connection and Isolation Panel [NHANES]    Frequency of Communication with Friends and Family: Three times a week    Frequency of Social Gatherings with Friends and Family: Three times a week    Attends Religious Services: More than 4 times per year     Active Member of Clubs or Organizations: Patient unable to answer    Attends Club or Organization Meetings: More than 4 times per year    Marital Status: Never married  Intimate Partner Violence: Not At Risk (10/26/2022)   Humiliation, Afraid, Rape, and Kick questionnaire    Fear of Current or Ex-Partner: No    Emotionally Abused: No    Physically Abused: No    Sexually Abused: No    Review of Systems  Skin:  Positive for itching.  All other systems reviewed and are negative.       Objective    BP 116/76   Pulse 84   Temp 98.1 F (36.7 C) (Oral)   Resp 16   Ht 4\' 11"  (1.499 m)   Wt 93 lb 3.2 oz (42.3 kg)   SpO2 97%   BMI 18.82 kg/m   Physical Exam Vitals and nursing note reviewed.  Constitutional:      General: She is not in acute distress. Cardiovascular:     Rate and Rhythm: Normal rate and regular rhythm.  Pulmonary:     Effort: Pulmonary effort is normal.     Breath sounds: Normal breath sounds.  Skin:    Findings: Lesion (intermittent over entire body) present.  Neurological:     General: No focal deficit present.     Mental Status: She is alert and oriented to person, place, and time.         Assessment & Plan:   1. Dermatitis Referral to derm for further eval/mgt. Atarax prescribed for itching.  - Ambulatory referral to Dermatology  2. Encounter to establish care     Return if symptoms worsen or fail to improve.   Tommie Raymond, MD

## 2022-10-30 NOTE — Telephone Encounter (Signed)
Talk with patient and she was given number to dermatologist to make her appt

## 2022-10-30 NOTE — Telephone Encounter (Signed)
I have attempted without success to contact this patient by phone to return their call and I left a message on answering machine.

## 2022-11-06 ENCOUNTER — Telehealth: Payer: Medicaid Other | Admitting: Family Medicine

## 2022-11-06 ENCOUNTER — Encounter: Payer: Self-pay | Admitting: Family Medicine

## 2022-11-06 DIAGNOSIS — L309 Dermatitis, unspecified: Secondary | ICD-10-CM

## 2022-11-06 NOTE — Progress Notes (Signed)
Virtual Visit via Video Note  I connected with Wanda Howard on 11/06/22 at 10:40 AM EDT by a video enabled telemedicine application and verified that I am speaking with the correct person using two identifiers.  Location: Patient: [name] Provider: [place]   I discussed the limitations of evaluation and management by telemedicine and the availability of in person appointments. The patient expressed understanding and agreed to proceed.  History of Present Illness:  Patient reports that she continues to have itching with lesion and the soonest appt so far available was September.    Observations/Objective:   Assessment and Plan: 1. Dermatitis Discussed skin care. Continue hydroxyzine.    Follow Up Instructions:    I discussed the assessment and treatment plan with the patient. The patient was provided an opportunity to ask questions and all were answered. The patient agreed with the plan and demonstrated an understanding of the instructions.   The patient was advised to call back or seek an in-person evaluation if the symptoms worsen or if the condition fails to improve as anticipated.  I provided 7 minutes of non-face-to-face time during this encounter.   Tommie Raymond, MD

## 2022-11-12 ENCOUNTER — Telehealth: Payer: Self-pay | Admitting: *Deleted

## 2022-11-12 NOTE — Telephone Encounter (Signed)
Call place to patient regarding referrals. Patient was told that she has Physiological scientist and our coordinator is actively trying to locate a Dermatologist. Patient has request removal of insurance [employer]) she state she does not have this insurance and not sure why it's on there.  Front office notified

## 2022-12-28 ENCOUNTER — Ambulatory Visit: Payer: No Typology Code available for payment source | Admitting: Family

## 2023-01-01 ENCOUNTER — Ambulatory Visit
Admission: EM | Admit: 2023-01-01 | Discharge: 2023-01-01 | Disposition: A | Discharge status: Home / Self Care | Payer: No Typology Code available for payment source

## 2023-01-01 ENCOUNTER — Other Ambulatory Visit: Payer: Self-pay

## 2023-01-01 DIAGNOSIS — K0889 Other specified disorders of teeth and supporting structures: Secondary | ICD-10-CM | POA: Diagnosis not present

## 2023-01-01 DIAGNOSIS — R21 Rash and other nonspecific skin eruption: Secondary | ICD-10-CM

## 2023-01-01 DIAGNOSIS — K047 Periapical abscess without sinus: Secondary | ICD-10-CM

## 2023-01-01 MED ORDER — LIDOCAINE VISCOUS HCL 2 % MT SOLN: 15.0000 mL | Freq: Four times a day (QID) | OROMUCOSAL | 0 refills | Status: AC | PRN

## 2023-01-01 MED ORDER — CLINDAMYCIN HCL 150 MG PO CAPS: 300.0000 mg | ORAL_CAPSULE | Freq: Three times a day (TID) | ORAL | 0 refills | Status: AC

## 2023-01-01 NOTE — ED Provider Notes (Signed)
[facility]    CSN: [id number] Arrival date & time: 01/01/23  1726      History   Chief Complaint Chief Complaint  Patient presents with   Rash   Dental Pain    HPI Wanda Howard is a 42 y.o. female.   Patient presents with 2 different chief complaints today.  Patient reports left lower dental pain that has been present for a few days.  Denies injury to the area.  Denies any documented fevers.  Reports that saw a dentist approximately 1 month ago and states that she has a follow-up appointment for teeth to be pulled.  She has not been on any recent antibiotics. Has taken ibuprofen for pain.  Patient also reporting skin changes.  Reports that she has been seen multiple times for rash to her skin that has been present for weeks to months.  Reports that she saw dermatology and they told her that she had psoriasis.  They put her on clobetasol about a week ago and she is concerned the rash has become more bumpy.  She tried to call dermatology today but did not get a call back   Rash Dental Pain   Past Medical History:  Diagnosis Date   Anxiety    Carpal tunnel syndrome    Depression     Patient Active Problem List   Diagnosis Date Noted   Gastroenteritis 05/24/2021   Polysubstance abuse (HCC) 05/24/2021   ARF (acute renal failure) (HCC) 05/24/2021   AKI (acute kidney injury) (HCC) 05/23/2021   MDD (major depressive disorder), recurrent episode, severe (HCC) 12/22/2018    Past Surgical History:  Procedure Laterality Date   CESAREAN SECTION      OB History   No obstetric history on file.      Home Medications    Prior to Admission medications   Medication Sig Start Date End Date Taking? Authorizing Provider  clindamycin (CLEOCIN) 150 MG capsule Take 2 capsules (300 mg total) by mouth 3 (three) times daily for 5 days. 01/01/23 01/06/23 Yes , Acie Fredrickson, FNP  clobetasol ointment (TEMOVATE) 0.05 % Apply 1 Application topically 2 (two) times daily.    Yes [provider]  hydrOXYzine (ATARAX) 25 MG tablet Take 1 tablet (25 mg total) by mouth every 6 (six) hours as needed for itching. 10/26/22  Yes Georganna Skeans, MD  lidocaine (XYLOCAINE) 2 % solution Use as directed 15 mLs in the mouth or throat every 6 (six) hours as needed for mouth pain. 01/01/23  Yes , Rolly Salter E, FNP  medroxyPROGESTERone (DEPO-PROVERA) 150 MG/ML injection Inject 150 mg into the muscle every 3 (three) months. Patient not taking: Reported on 05/24/2021    [provider]  nystatin-triamcinolone (MYCOLOG II) cream Apply to affected area daily Patient not taking: Reported on 10/26/2022 10/09/22   Coralyn Mark, NP  ondansetron (ZOFRAN-ODT) 4 MG disintegrating tablet Take 1 tablet (4 mg total) by mouth every 8 (eight) hours as needed for nausea or vomiting. Patient not taking: Reported on 05/24/2021 05/20/21   Sabas Sous, MD  polyethylene glycol (MIRALAX / GLYCOLAX) 17 g packet Take 17 g by mouth daily as needed for moderate constipation. Patient not taking: Reported on 10/26/2022 05/25/21   Jerald Kief, MD  QUEtiapine (SEROQUEL) 50 MG tablet Take 1 tablet (50 mg total) by mouth at bedtime. Patient not taking: Reported on 05/24/2021 12/26/18   Malvin Johns, MD  triamcinolone cream (KENALOG) 0.1 % Apply 1 Application topically 2 (two) times daily.  Patient not taking: Reported on 10/26/2022 10/09/22   Coralyn Mark, NP    Family History History reviewed. No pertinent family history.  Social History Social History   Tobacco Use   Smoking status: Every Day    Current packs/day: 1.00    Types: Cigarettes   Smokeless tobacco: Never  Vaping Use   Vaping status: Never Used  Substance Use Topics   Alcohol use: Yes   Drug use: Not Currently    Frequency: 7.0 times per week    Types: Amphetamines, Cocaine, Marijuana    Comment: +Amph; THC; Coc     Allergies   Latex and Penicillins   Review of Systems Review of Systems Per HPI  Physical  Exam Triage Vital Signs ED Triage Vitals  Encounter Vitals Group     BP 01/01/23 1739 (!) 157/80     Systolic BP Percentile --      Diastolic BP Percentile --      Pulse Rate 01/01/23 1739 78     Resp 01/01/23 1739 18     Temp 01/01/23 1739 98 F (36.7 C)     Temp Source 01/01/23 1739 Oral     SpO2 01/01/23 1739 97 %     Weight 01/01/23 1736 94 lb 11.2 oz (43 kg)     Height 01/01/23 1736 4\' 11"  (1.499 m)     Head Circumference --      Peak Flow --      Pain Score 01/01/23 1735 6     Pain Loc --      Pain Education --      Exclude from Growth Chart --    No data found.  Updated Vital Signs BP (!) 153/92 (BP Location: Left Arm)   Pulse 73   Temp 98 F (36.7 C) (Oral)   Resp 18   Ht 4\' 11"  (1.499 m)   Wt 94 lb 11.2 oz (43 kg)   LMP  (LMP Unknown)   SpO2 97%   BMI 19.13 kg/m   Visual Acuity Right Eye Distance:   Left Eye Distance:   Bilateral Distance:    Right Eye Near:   Left Eye Near:    Bilateral Near:     Physical Exam Constitutional:      General: She is not in acute distress.    Appearance: Normal appearance. She is not toxic-appearing or diaphoretic.  HENT:     Head: Normocephalic and atraumatic.     Mouth/Throat:      Comments: Patient has gingival swelling and erythema surrounding left lower dentition. Eyes:     Extraocular Movements: Extraocular movements intact.     Conjunctiva/sclera: Conjunctivae normal.  Pulmonary:     Effort: Pulmonary effort is normal.  Skin:    Comments: She has various circular like lesions that are flat and scattered throughout bilateral upper extremities, right hand, bilateral lower extremities.  Neurological:     General: No focal deficit present.     Mental Status: She is alert and oriented to person, place, and time. Mental status is at baseline.  Psychiatric:        Mood and Affect: Mood normal.        Behavior: Behavior normal.        Thought Content: Thought content normal.        Judgment: Judgment normal.       UC Treatments / Results  Labs (all labs ordered are listed, but only abnormal results are displayed) Labs Reviewed - No data  to display  EKG   Radiology No results found.  Procedures Procedures (including critical care time)  Medications Ordered in UC Medications - No data to display  Initial Impression / Assessment and Plan / UC Course  I have reviewed the triage vital signs and the nursing notes.  Pertinent labs & imaging results that were available during my care of the patient were reviewed by me and considered in my medical decision making (see chart for details).     1.  Dental infection  Will treat with clindamycin given penicillin allergy.  Advised patient to follow-up with dentist for further evaluation and management.  Viscous lidocaine solution also prescribed to help alleviate discomfort.  2.  Rash  Patient is concerned that clobetasol cream is causing rash to be more bumpy.  On physical exam, rash is not concerning for any worrisome etiology.  She has been told that she has psoriasis by dermatology.  Rash does appear possibly consistent with this.  Advised patient to follow-up with dermatology for further evaluation and management.  Patient verbalized understanding and was agreeable with plan. Final Clinical Impressions(s) / UC Diagnoses   Final diagnoses:  Dental infection  Pain, dental  Rash and nonspecific skin eruption     Discharge Instructions      I have prescribed you clindamycin for dental infection.  I have also prescribed you a topical lidocaine solution to apply directly to area to help with pain.  Follow-up with dermatology for rash.    ED Prescriptions     Medication Sig Dispense Auth. Provider   clindamycin (CLEOCIN) 150 MG capsule Take 2 capsules (300 mg total) by mouth 3 (three) times daily for 5 days. 28 capsule [place], [place], [place]   lidocaine (XYLOCAINE) 2 % solution Use as directed 15 mLs in the mouth or throat every 6  (six) hours as needed for mouth pain. 100 mL [place], [place]      PDMP not reviewed this encounter.   Gustavus Bryant, [place] 01/01/23 [phone]

## 2023-01-01 NOTE — ED Triage Notes (Signed)
"  I have a really bad toothache in left lower part of my mouth".   Also, I recently was diagnosed with Psoriasis and using the treatment provided "and seeing changes that concern me in my skin".

## 2023-01-01 NOTE — Discharge Instructions (Addendum)
I have prescribed you clindamycin for dental infection.  I have also prescribed you a topical lidocaine solution to apply directly to area to help with pain.  Follow-up with dermatology for rash.

## 2023-10-22 IMAGING — CT CT ABD-PELV W/ CM
2 of 4 series · 16 of 46 positions shown, 18 images · IV contrast (omnipaque)
Comparison: None.

CLINICAL DATA: Nausea vomiting.  Epigastric pain.

EXAM:
CT ABDOMEN AND PELVIS WITH CONTRAST
TECHNIQUE: Multidetector CT imaging of the abdomen and pelvis was performed
using the standard protocol following bolus administration of
intravenous contrast.
CONTRAST:  75mL OMNIPAQUE IOHEXOL 350 MG/ML SOLN

[Series 3: a/p w/ 5mm · axial · 0.60mm/px · z∈[-365,-30]mm · 13 of 73 slices shown, 15 images]
[im 3/73  soft-tissue]
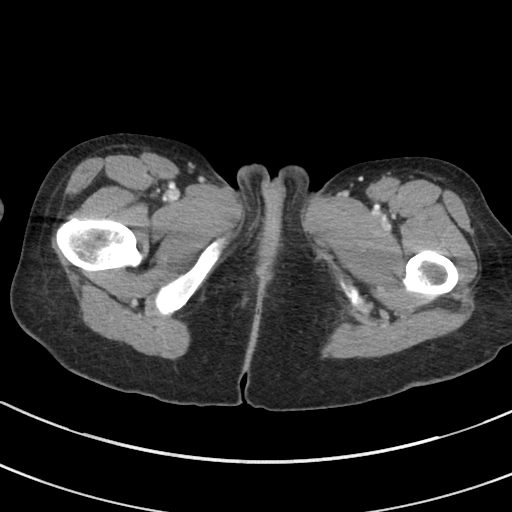
[im 3/73  bone]
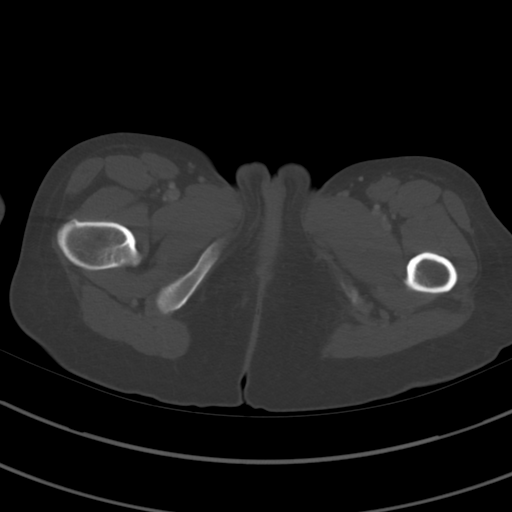
[im 9/73  soft-tissue]
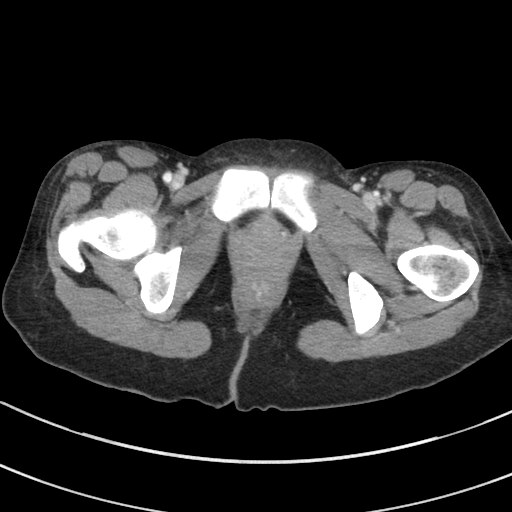
[im 15/73  soft-tissue]
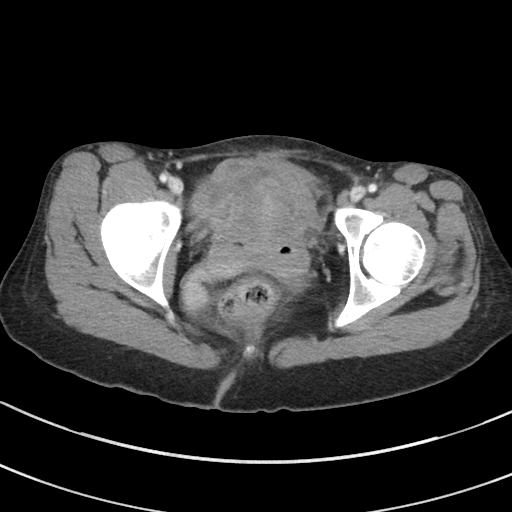
[im 21/73  soft-tissue]
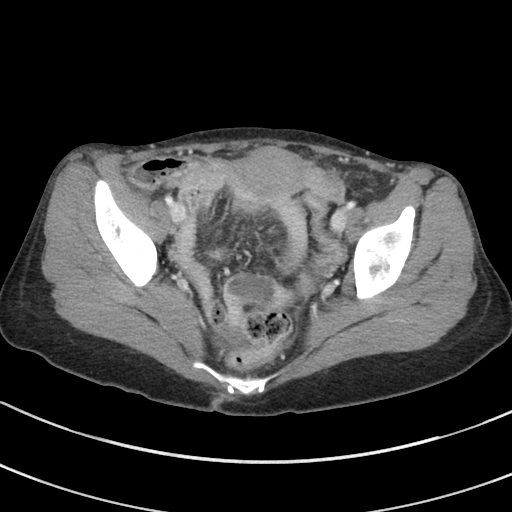
[im 26/73  soft-tissue]
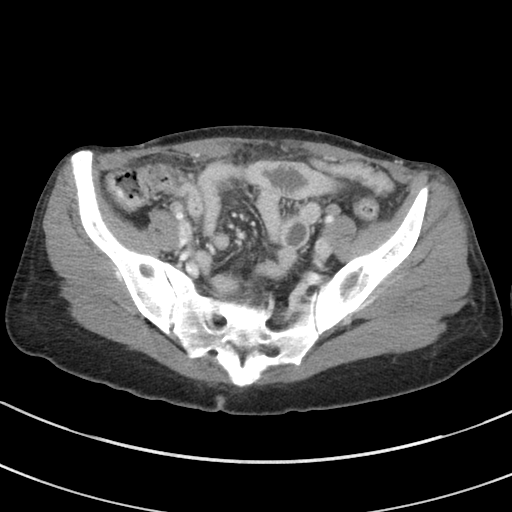
[im 32/73  soft-tissue]
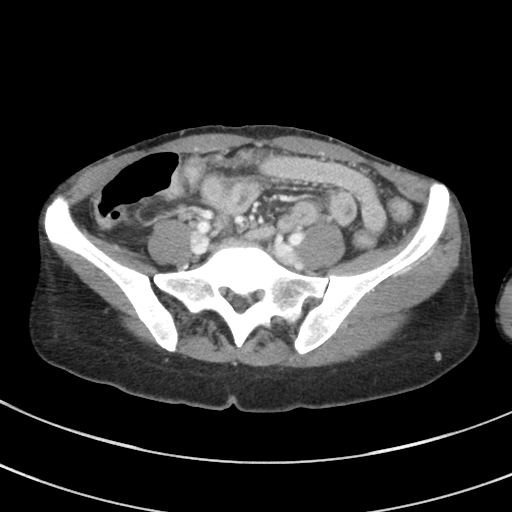
[im 38/73  soft-tissue]
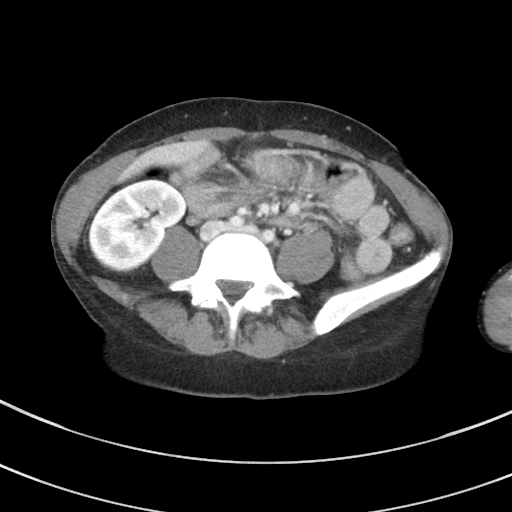
[im 41/73  soft-tissue]
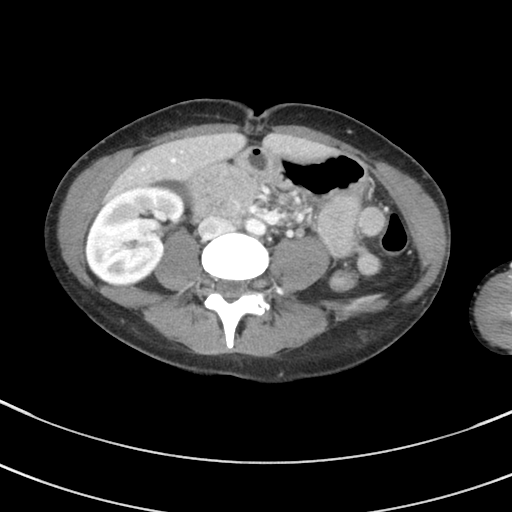
[im 47/73  soft-tissue]
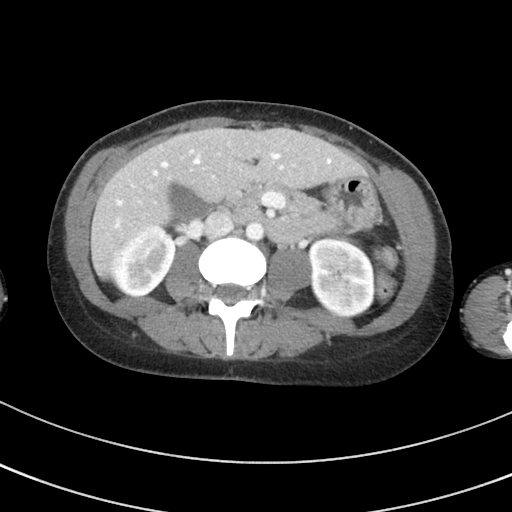
[im 47/73  bone]
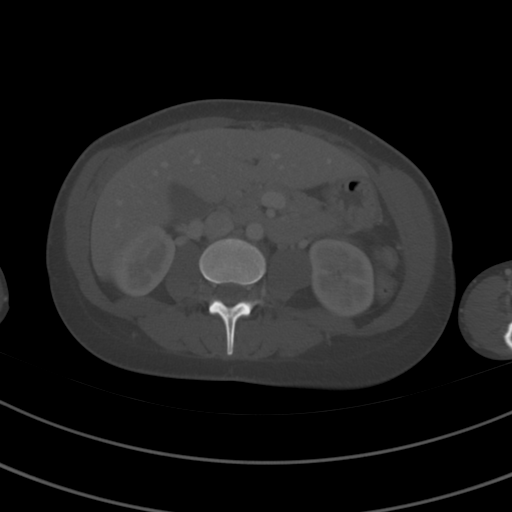
[im 52/73  soft-tissue]
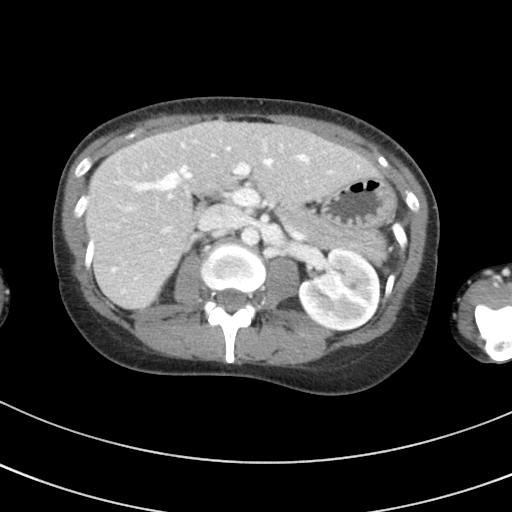
[im 58/73  soft-tissue]
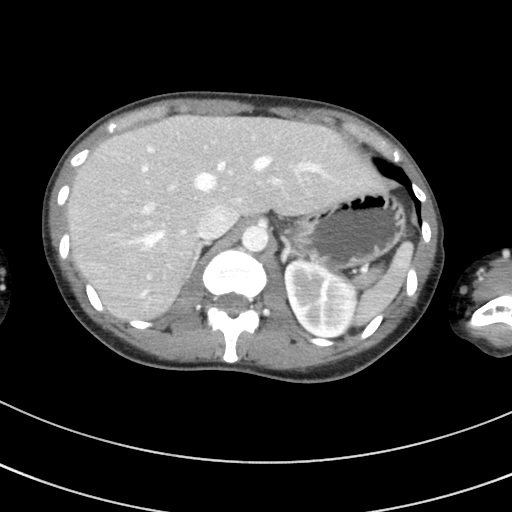
[im 64/73  soft-tissue]
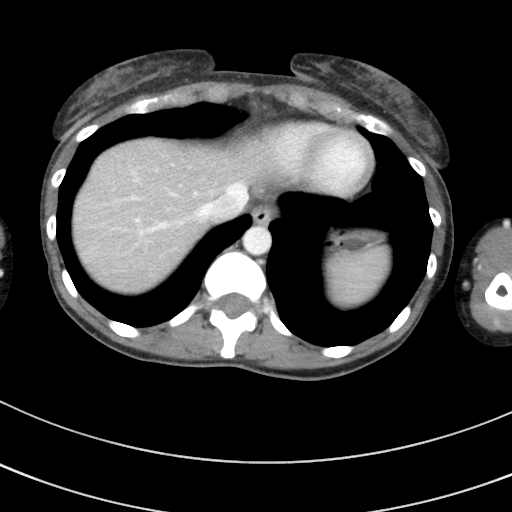
[im 70/73  soft-tissue]
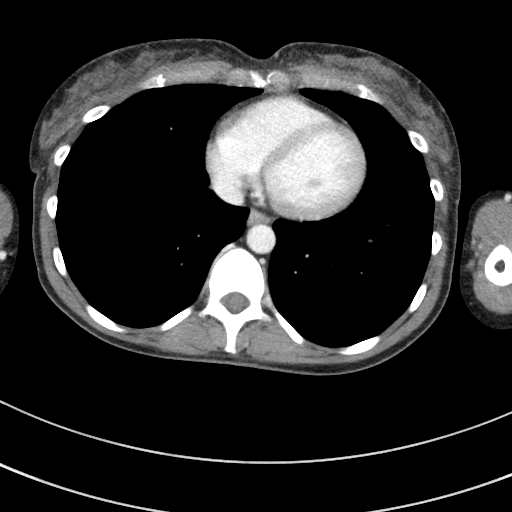

[Series 6: a/p w/ cor · coronal · 0.58mm/px · 3 of 105 slices shown]
[im 35/105  soft-tissue]
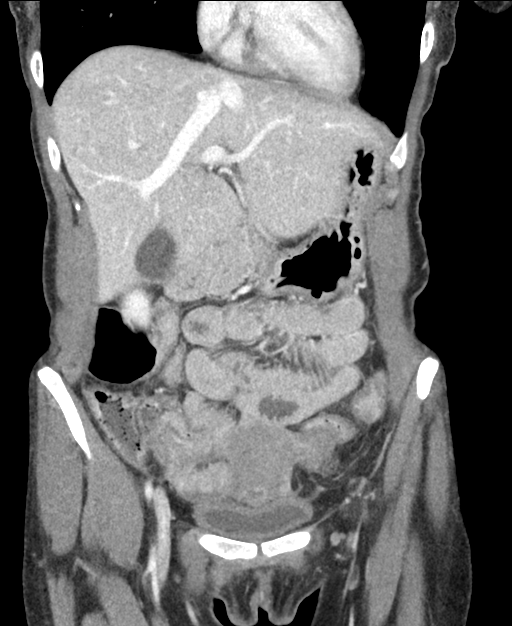
[im 47/105  soft-tissue]
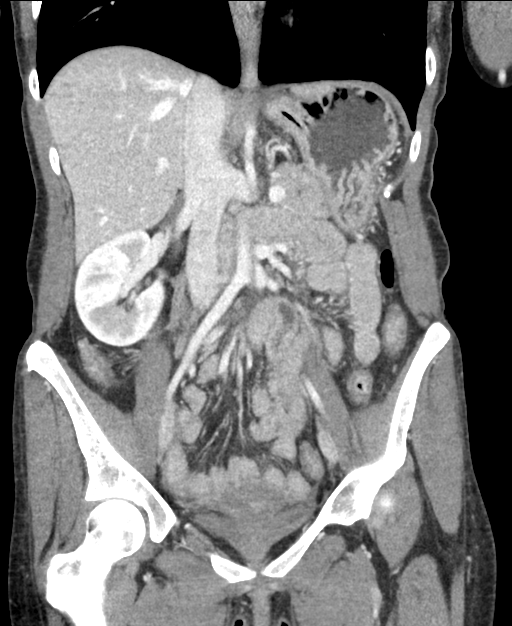
[im 58/105  soft-tissue]
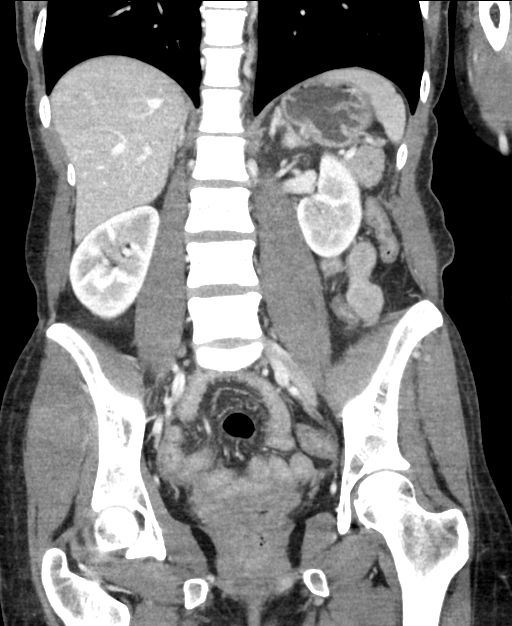

[16 of 46 positions shown; findings below may reference images not displayed]

FINDINGS: Lower chest: The visualized lung bases are clear.

No intra-abdominal free air.  Small free fluid within the pelvis.

Hepatobiliary: No focal liver abnormality is seen. No gallstones,
gallbladder wall thickening, or biliary dilatation.

Pancreas: Unremarkable. No pancreatic ductal dilatation or
surrounding inflammatory changes.

Spleen: Normal in size without focal abnormality.

Adrenals/Urinary Tract: The adrenal glands unremarkable. The
kidneys, visualized ureters, and urinary bladder appear
unremarkable.

Stomach/Bowel: There is diffuse thickened appearance of the small
bowel loops most consistent with enteritis. Clinical correlation is
recommended. No bowel obstruction. The appendix is normal as
visualized.

Vascular/Lymphatic: The abdominal aorta and IVC are unremarkable. No
portal venous gas. There is no adenopathy.

Reproductive: The uterus is anteverted and grossly unremarkable. No
adnexal masses.

Other: None

Musculoskeletal: No acute or significant osseous findings.
IMPRESSION: Findings most consistent with enteritis. No bowel obstruction.
Normal appendix.

## 2024-04-26 ENCOUNTER — Ambulatory Visit: Admission: EM | Admit: 2024-04-26 | Discharge: 2024-04-26 | Disposition: A | Discharge status: Home / Self Care

## 2024-04-26 ENCOUNTER — Encounter: Payer: Self-pay | Admitting: Emergency Medicine

## 2024-04-26 DIAGNOSIS — Z202 Contact with and (suspected) exposure to infections with a predominantly sexual mode of transmission: Secondary | ICD-10-CM

## 2024-04-26 DIAGNOSIS — Z113 Encounter for screening for infections with a predominantly sexual mode of transmission: Secondary | ICD-10-CM | POA: Diagnosis present

## 2024-04-26 MED ORDER — DOXYCYCLINE HYCLATE 100 MG PO CAPS: 100.0000 mg | ORAL_CAPSULE | Freq: Two times a day (BID) | ORAL | 0 refills | Status: AC

## 2024-04-26 MED ORDER — GENTAMICIN SULFATE 40 MG/ML IJ SOLN
5.0000 mg/kg | Freq: Once | INTRAMUSCULAR | Status: AC
  Administered 2024-04-26: 208 mg via INTRAMUSCULAR

## 2024-04-26 NOTE — ED Provider Notes (Signed)
 [facility]    CSN: [id number] Arrival date & time: 04/26/24  1350      History   Chief Complaint Chief Complaint  Patient presents with   Exposure to STD    HPI Wanda Howard is a 43 y.o. female.   Patient presents today due to concern for STIs.  Patient states that her partner told her that they tested positive for an STI.  Patient states that her partner was given an injection  in office and sent home with a prescription of doxycycline.  Patient denies dysuria, urinary frequency, abnormal vaginal discharge, or back pain.  The history is provided by the patient.  Exposure to STD    Past Medical History:  Diagnosis Date   Anxiety    Carpal tunnel syndrome    Depression     Patient Active Problem List   Diagnosis Date Noted   Gastroenteritis 05/24/2021   Polysubstance abuse (HCC) 05/24/2021   ARF (acute renal failure) 05/24/2021   AKI (acute kidney injury) 05/23/2021   MDD (major depressive disorder), recurrent episode, severe (HCC) 12/22/2018    Past Surgical History:  Procedure Laterality Date   CESAREAN SECTION      OB History   No obstetric history on file.      Home Medications    Prior to Admission medications   Medication Sig Start Date End Date Taking? Authorizing Provider  doxycycline (VIBRAMYCIN) 100 MG capsule Take 1 capsule (100 mg total) by mouth 2 (two) times daily for 7 days. 04/26/24 05/03/24 Yes Andra Corean BROCKS, PA-C  methotrexate (RHEUMATREX) 2.5 MG tablet Take by mouth. 01/11/24  Yes [provider]  clobetasol ointment (TEMOVATE) 0.05 % Apply 1 Application topically 2 (two) times daily.    [provider]  hydrOXYzine  (ATARAX ) 25 MG tablet Take 1 tablet (25 mg total) by mouth every 6 (six) hours as needed for itching. 10/26/22   Tanda Bleacher, MD  lidocaine  (XYLOCAINE ) 2 % solution Use as directed 15 mLs in the mouth or throat every 6 (six) hours as needed for mouth pain. 01/01/23   Hazen Darryle BRAVO, FNP   medroxyPROGESTERone (DEPO-PROVERA) 150 MG/ML injection Inject 150 mg into the muscle every 3 (three) months. Patient not taking: Reported on 05/24/2021    [provider]  nystatin -triamcinolone  (MYCOLOG II) cream Apply to affected area daily Patient not taking: Reported on 10/26/2022 10/09/22   Merilee Andrea CROME, NP  ondansetron  (ZOFRAN -ODT) 4 MG disintegrating tablet Take 1 tablet (4 mg total) by mouth every 8 (eight) hours as needed for nausea or vomiting. Patient not taking: Reported on 05/24/2021 05/20/21   Bero, Michael M, MD  polyethylene glycol (MIRALAX  / GLYCOLAX ) 17 g packet Take 17 g by mouth daily as needed for moderate constipation. Patient not taking: Reported on 10/26/2022 05/25/21   Cindy Garnette POUR, MD  QUEtiapine  (SEROQUEL ) 50 MG tablet Take 1 tablet (50 mg total) by mouth at bedtime. Patient not taking: Reported on 05/24/2021 12/26/18   Malinda Rogue, MD  triamcinolone  cream (KENALOG ) 0.1 % Apply 1 Application topically 2 (two) times daily. Patient not taking: Reported on 10/26/2022 10/09/22   Merilee Andrea CROME, NP    Family History History reviewed. No pertinent family history.  Social History Social History   Tobacco Use   Smoking status: Every Day    Current packs/day: 1.00    Types: Cigarettes    Passive exposure: Current   Smokeless tobacco: Never  Vaping Use   Vaping status: Never Used  Substance  Use Topics   Alcohol use: Yes   Drug use: Not Currently    Frequency: 7.0 times per week    Types: Amphetamines, Cocaine, Marijuana    Comment: +Amph; THC; Coc     Allergies   Latex and Penicillins   Review of Systems Review of Systems   Physical Exam Triage Vital Signs ED Triage Vitals  Encounter Vitals Group     BP 04/26/24 1430 (!) 169/95     Girls Systolic BP Percentile --      Girls Diastolic BP Percentile --      Boys Systolic BP Percentile --      Boys Diastolic BP Percentile --      Pulse Rate 04/26/24 1430 (!) 57     Resp 04/26/24 1430  18     Temp 04/26/24 1430 98.2 F (36.8 C)     Temp Source 04/26/24 1430 Oral     SpO2 04/26/24 1430 98 %     Weight 04/26/24 1440 91 lb 12.8 oz (41.6 kg)     Height --      Head Circumference --      Peak Flow --      Pain Score 04/26/24 1439 0     Pain Loc --      Pain Education --      Exclude from Growth Chart --    No data found.  Updated Vital Signs BP (!) 169/95 (BP Location: Left Arm)   Pulse (!) 57   Temp 98.2 F (36.8 C) (Oral)   Resp 16   Wt 91 lb 12.8 oz (41.6 kg)   LMP  (LMP Unknown)   SpO2 97%   BMI 18.54 kg/m   Visual Acuity Right Eye Distance:   Left Eye Distance:   Bilateral Distance:    Right Eye Near:   Left Eye Near:    Bilateral Near:     Physical Exam Vitals and nursing note reviewed.  Constitutional:      General: She is not in acute distress.    Appearance: Normal appearance. She is not ill-appearing, toxic-appearing or diaphoretic.  Eyes:     General: No scleral icterus. Cardiovascular:     Rate and Rhythm: Normal rate and regular rhythm.     Heart sounds: Normal heart sounds.  Pulmonary:     Effort: Pulmonary effort is normal. No respiratory distress.     Breath sounds: Normal breath sounds. No wheezing or rhonchi.  Abdominal:     General: Abdomen is flat. Bowel sounds are normal.     Palpations: Abdomen is soft.     Tenderness: There is no abdominal tenderness. There is no right CVA tenderness or left CVA tenderness.  Skin:    General: Skin is warm.  Neurological:     Mental Status: She is alert and oriented to person, place, and time.  Psychiatric:        Mood and Affect: Mood normal.        Behavior: Behavior normal.      UC Treatments / Results  Labs (all labs ordered are listed, but only abnormal results are displayed) Labs Reviewed  CERVICOVAGINAL ANCILLARY ONLY    EKG   Radiology No results found.  Procedures Procedures (including critical care time)  Medications Ordered in UC Medications  gentamicin  (GARAMYCIN) injection 208 mg (has no administration in time range)    Initial Impression / Assessment and Plan / UC Course  I have reviewed the triage vital signs and the  nursing notes.  Pertinent labs & imaging results that were available during my care of the patient were reviewed by me and considered in my medical decision making (see chart for details).     Final Clinical Impressions(s) / UC Diagnoses   Final diagnoses:  Contact with and (suspected) exposure to infections with a predominantly sexual mode of transmission   Discharge Instructions   None    ED Prescriptions     Medication Sig Dispense Auth. Provider   doxycycline (VIBRAMYCIN) 100 MG capsule Take 1 capsule (100 mg total) by mouth 2 (two) times daily for 7 days. 14 capsule Andra Corean BROCKS, PA-C      PDMP not reviewed this encounter.   Andra Corean BROCKS, PA-C 04/26/24 [phone]

## 2024-04-26 NOTE — ED Triage Notes (Addendum)
 Pt presents requesting STD testing. Pt reports sxs as non existent but says her partner said he tested positive for something and she isn't sure what. Last sexual encounter 3 days ago.

## 2024-04-26 NOTE — Discharge Instructions (Addendum)
 You were tested for STIs today. You should receive your results in 3-5 days. If you have not received a call from the office or see results in your mychart please call the clinic where you were seen. It is best if your refrain from sexual activity until you get results back. If positive you will need to refrain from sexual activity for 2 weeks and be sure to complete any antibiotics prescribed in their entirety. '

## 2024-04-27 LAB — CERVICOVAGINAL ANCILLARY ONLY
Bacterial Vaginitis (gardnerella): POSITIVE — AB
Candida Glabrata: NEGATIVE
Candida Vaginitis: NEGATIVE
Chlamydia: NEGATIVE
Comment: NEGATIVE
Comment: NEGATIVE
Comment: NEGATIVE
Comment: NEGATIVE
Comment: NEGATIVE
Comment: NORMAL
Neisseria Gonorrhea: NEGATIVE
Trichomonas: POSITIVE — AB

## 2024-04-28 ENCOUNTER — Ambulatory Visit (HOSPITAL_COMMUNITY): Payer: Self-pay

## 2024-04-28 MED ORDER — METRONIDAZOLE 500 MG PO TABS: 500.0000 mg | ORAL_TABLET | Freq: Two times a day (BID) | ORAL | 0 refills | Status: AC
